# Patient Record
Sex: Male | Born: 1985 | Hispanic: Yes | Marital: Married | Smoking: Never smoker
Health system: Southern US, Community
[De-identification: ages and names within clinical notes are randomized; demographics above are authoritative.]

## PROBLEM LIST (undated history)

## (undated) DIAGNOSIS — Z789 Other specified health status: Secondary | ICD-10-CM

## (undated) HISTORY — PX: NO PAST SURGERIES: SHX2092

---

## 2020-12-12 ENCOUNTER — Emergency Department (HOSPITAL_COMMUNITY): Payer: Commercial Managed Care - PPO

## 2020-12-12 ENCOUNTER — Encounter (HOSPITAL_COMMUNITY): Payer: Self-pay

## 2020-12-12 ENCOUNTER — Inpatient Hospital Stay (HOSPITAL_COMMUNITY)
Admission: EM | Admit: 2020-12-12 | Discharge: 2020-12-15 | DRG: 493 | Disposition: A | Discharge status: Home / Self Care | Payer: Commercial Managed Care - PPO | Attending: General Surgery | Admitting: General Surgery

## 2020-12-12 ENCOUNTER — Inpatient Hospital Stay (HOSPITAL_COMMUNITY): Payer: Commercial Managed Care - PPO

## 2020-12-12 DIAGNOSIS — Z20822 Contact with and (suspected) exposure to covid-19: Secondary | ICD-10-CM | POA: Diagnosis present

## 2020-12-12 DIAGNOSIS — S82251A Displaced comminuted fracture of shaft of right tibia, initial encounter for closed fracture: Principal | ICD-10-CM | POA: Diagnosis present

## 2020-12-12 DIAGNOSIS — S32028A Other fracture of second lumbar vertebra, initial encounter for closed fracture: Secondary | ICD-10-CM | POA: Diagnosis present

## 2020-12-12 DIAGNOSIS — T148XXA Other injury of unspecified body region, initial encounter: Secondary | ICD-10-CM

## 2020-12-12 DIAGNOSIS — Y9289 Other specified places as the place of occurrence of the external cause: Secondary | ICD-10-CM

## 2020-12-12 DIAGNOSIS — W11XXXA Fall on and from ladder, initial encounter: Secondary | ICD-10-CM | POA: Diagnosis present

## 2020-12-12 DIAGNOSIS — R17 Unspecified jaundice: Secondary | ICD-10-CM | POA: Diagnosis present

## 2020-12-12 DIAGNOSIS — S32048A Other fracture of fourth lumbar vertebra, initial encounter for closed fracture: Secondary | ICD-10-CM | POA: Diagnosis present

## 2020-12-12 DIAGNOSIS — S82401A Unspecified fracture of shaft of right fibula, initial encounter for closed fracture: Secondary | ICD-10-CM

## 2020-12-12 DIAGNOSIS — S82451A Displaced comminuted fracture of shaft of right fibula, initial encounter for closed fracture: Secondary | ICD-10-CM | POA: Diagnosis present

## 2020-12-12 DIAGNOSIS — M79661 Pain in right lower leg: Secondary | ICD-10-CM | POA: Diagnosis present

## 2020-12-12 DIAGNOSIS — S32018A Other fracture of first lumbar vertebra, initial encounter for closed fracture: Secondary | ICD-10-CM | POA: Diagnosis present

## 2020-12-12 DIAGNOSIS — T1490XA Injury, unspecified, initial encounter: Secondary | ICD-10-CM

## 2020-12-12 DIAGNOSIS — G8911 Acute pain due to trauma: Secondary | ICD-10-CM | POA: Diagnosis present

## 2020-12-12 DIAGNOSIS — S32009A Unspecified fracture of unspecified lumbar vertebra, initial encounter for closed fracture: Secondary | ICD-10-CM | POA: Diagnosis present

## 2020-12-12 DIAGNOSIS — S32038A Other fracture of third lumbar vertebra, initial encounter for closed fracture: Secondary | ICD-10-CM | POA: Diagnosis present

## 2020-12-12 DIAGNOSIS — R58 Hemorrhage, not elsewhere classified: Secondary | ICD-10-CM

## 2020-12-12 DIAGNOSIS — R52 Pain, unspecified: Secondary | ICD-10-CM

## 2020-12-12 HISTORY — DX: Other specified health status: Z78.9

## 2020-12-12 LAB — CBC WITH DIFFERENTIAL/PLATELET
Abs Immature Granulocytes: 0.12 10*3/uL — ABNORMAL HIGH (ref 0.00–0.07)
Basophils Absolute: 0.1 10*3/uL (ref 0.0–0.1)
Basophils Relative: 0 %
Eosinophils Absolute: 0 10*3/uL (ref 0.0–0.5)
Eosinophils Relative: 0 %
HCT: 38.1 % — ABNORMAL LOW (ref 39.0–52.0)
Hemoglobin: 12.9 g/dL — ABNORMAL LOW (ref 13.0–17.0)
Immature Granulocytes: 1 %
Lymphocytes Relative: 5 %
Lymphs Abs: 0.9 10*3/uL (ref 0.7–4.0)
MCH: 30.7 pg (ref 26.0–34.0)
MCHC: 33.9 g/dL (ref 30.0–36.0)
MCV: 90.7 fL (ref 80.0–100.0)
Monocytes Absolute: 1.3 10*3/uL — ABNORMAL HIGH (ref 0.1–1.0)
Monocytes Relative: 7 %
Neutro Abs: 15.3 10*3/uL — ABNORMAL HIGH (ref 1.7–7.7)
Neutrophils Relative %: 87 %
Platelets: 216 10*3/uL (ref 150–400)
RBC: 4.2 MIL/uL — ABNORMAL LOW (ref 4.22–5.81)
RDW: 11.9 % (ref 11.5–15.5)
WBC: 17.6 10*3/uL — ABNORMAL HIGH (ref 4.0–10.5)
nRBC: 0 % (ref 0.0–0.2)

## 2020-12-12 LAB — COMPREHENSIVE METABOLIC PANEL
ALT: 26 U/L (ref 0–44)
AST: 43 U/L — ABNORMAL HIGH (ref 15–41)
Albumin: 4.1 g/dL (ref 3.5–5.0)
Alkaline Phosphatase: 74 U/L (ref 38–126)
Anion gap: 11 (ref 5–15)
BUN: 27 mg/dL — ABNORMAL HIGH (ref 6–20)
CO2: 22 mmol/L (ref 22–32)
Calcium: 9.1 mg/dL (ref 8.9–10.3)
Chloride: 107 mmol/L (ref 98–111)
Creatinine, Ser: 1.45 mg/dL — ABNORMAL HIGH (ref 0.61–1.24)
GFR, Estimated: >60 mL/min (ref >60)
Glucose, Bld: 107 mg/dL — ABNORMAL HIGH (ref 70–99)
Potassium: 3.9 mmol/L (ref 3.5–5.1)
Sodium: 140 mmol/L (ref 135–145)
Total Bilirubin: 2.1 mg/dL — ABNORMAL HIGH (ref 0.3–1.2)
Total Protein: 6.9 g/dL (ref 6.5–8.1)

## 2020-12-12 LAB — I-STAT CHEM 8, ED
BUN: 30 mg/dL — ABNORMAL HIGH (ref 6–20)
Calcium, Ion: 1.12 mmol/L — ABNORMAL LOW (ref 1.15–1.40)
Chloride: 107 mmol/L (ref 98–111)
Creatinine, Ser: 1.4 mg/dL — ABNORMAL HIGH (ref 0.61–1.24)
Glucose, Bld: 105 mg/dL — ABNORMAL HIGH (ref 70–99)
HCT: 38 % — ABNORMAL LOW (ref 39.0–52.0)
Hemoglobin: 12.9 g/dL — ABNORMAL LOW (ref 13.0–17.0)
Potassium: 4 mmol/L (ref 3.5–5.1)
Sodium: 141 mmol/L (ref 135–145)
TCO2: 23 mmol/L (ref 22–32)

## 2020-12-12 LAB — PROTIME-INR
INR: 1.1 (ref 0.8–1.2)
Prothrombin Time: 14.2 seconds (ref 11.4–15.2)

## 2020-12-12 LAB — CREATININE, SERUM
Creatinine, Ser: 1.22 mg/dL (ref 0.61–1.24)
GFR, Estimated: >60 mL/min (ref >60)

## 2020-12-12 LAB — RESP PANEL BY RT-PCR (FLU A&B, COVID) ARPGX2
Influenza A by PCR: NEGATIVE
Influenza B by PCR: NEGATIVE
SARS Coronavirus 2 by RT PCR: NEGATIVE

## 2020-12-12 MED ORDER — IOHEXOL 300 MG/ML  SOLN
100.0000 mL | Freq: Once | INTRAMUSCULAR | Status: AC | PRN
  Administered 2020-12-12: 100 mL via INTRAVENOUS

## 2020-12-12 MED ORDER — DOCUSATE SODIUM 100 MG PO CAPS
100.0000 mg | ORAL_CAPSULE | Freq: Two times a day (BID) | ORAL | Status: DC
  Administered 2020-12-12 – 2020-12-15 (×5): 100 mg via ORAL
  Filled 2020-12-12 (×5): qty 1

## 2020-12-12 MED ORDER — LACTATED RINGERS IV SOLN: INTRAVENOUS | Status: DC

## 2020-12-12 MED ORDER — FENTANYL CITRATE (PF) 100 MCG/2ML IJ SOLN
50.0000 ug | Freq: Once | INTRAMUSCULAR | Status: AC
  Administered 2020-12-12: 50 ug via INTRAVENOUS
  Filled 2020-12-12: qty 2

## 2020-12-12 MED ORDER — SODIUM CHLORIDE 0.9 % IV BOLUS
500.0000 mL | Freq: Once | INTRAVENOUS | Status: AC
  Administered 2020-12-12: 500 mL via INTRAVENOUS

## 2020-12-12 MED ORDER — HYDROMORPHONE HCL 1 MG/ML IJ SOLN
0.5000 mg | INTRAMUSCULAR | Status: DC | PRN
  Administered 2020-12-12 – 2020-12-13 (×2): 0.5 mg via INTRAVENOUS
  Filled 2020-12-12 (×2): qty 1

## 2020-12-12 MED ORDER — ENOXAPARIN SODIUM 30 MG/0.3ML IJ SOSY
30.0000 mg | PREFILLED_SYRINGE | Freq: Two times a day (BID) | INTRAMUSCULAR | Status: DC
  Administered 2020-12-13 – 2020-12-15 (×4): 30 mg via SUBCUTANEOUS
  Filled 2020-12-12 (×4): qty 0.3

## 2020-12-12 MED ORDER — ONDANSETRON 4 MG PO TBDP: 4.0000 mg | ORAL_TABLET | Freq: Four times a day (QID) | ORAL | Status: DC | PRN

## 2020-12-12 MED ORDER — ACETAMINOPHEN 325 MG PO TABS
650.0000 mg | ORAL_TABLET | Freq: Four times a day (QID) | ORAL | Status: DC
  Administered 2020-12-12 – 2020-12-15 (×11): 650 mg via ORAL
  Filled 2020-12-12 (×11): qty 2

## 2020-12-12 MED ORDER — ONDANSETRON HCL 4 MG/2ML IJ SOLN: 4.0000 mg | Freq: Four times a day (QID) | INTRAMUSCULAR | Status: DC | PRN

## 2020-12-12 MED ORDER — OXYCODONE HCL 5 MG PO TABS
5.0000 mg | ORAL_TABLET | ORAL | Status: DC | PRN
  Administered 2020-12-13: 5 mg via ORAL
  Administered 2020-12-13 – 2020-12-14 (×3): 10 mg via ORAL
  Administered 2020-12-14 (×2): 5 mg via ORAL
  Administered 2020-12-14 – 2020-12-15 (×2): 10 mg via ORAL
  Filled 2020-12-12: qty 1
  Filled 2020-12-12 (×2): qty 2
  Filled 2020-12-12: qty 1
  Filled 2020-12-12 (×5): qty 2

## 2020-12-12 MED ORDER — FENTANYL CITRATE (PF) 100 MCG/2ML IJ SOLN
75.0000 ug | Freq: Once | INTRAMUSCULAR | Status: AC
  Administered 2020-12-12: 75 ug via INTRAVENOUS
  Filled 2020-12-12: qty 2

## 2020-12-12 NOTE — ED Notes (Signed)
..  Trauma Response Nurse Note-  Reason for Call / Reason for Trauma activation:  Level 2 activation -- after arrival, - due to mechanism - fall from roof 15-20 foot fall from 2nd story roof-- was cutting trees, a branch hit right leg, causing him to fall off roof.   Initial Focused Assessment (If applicable, or please see trauma documentation):  Pt is awake, alert, denies any LOC- obviously deformed right lower leg, bruising lower back and left flank area.   Interventions: IV x 2 Labs CT Xray Meds  Plan of Care as of this note: Waiting results at this time  Event Summary:   -  The Following (if applicable):    -MD notified:     -Time of Page/Time of notification: 17:56    -TRN arrival Time: 18:00    -End time:

## 2020-12-12 NOTE — ED Provider Notes (Signed)
[facility] EMERGENCY DEPARTMENT Provider Note   CSN: [id number] Arrival date & time: 12/12/20  1744     History Chief Complaint  Patient presents with   Fall    Fall from ladder    Andrew Esparza is a 35 y.o. male.  HPI  35 year old male with no admitted past medical history who is Spanish-speaking presents the emergency department as a level 2 trauma.  Patient was reportedly at the top of the ladder and fell 15 feet onto the ground.  Obvious right lower extremity deformity and left flank pain.  Patient denies being on any blood thinning medication.  Interpreter used.  Vitals are stable on arrival, received ketamine prior to arrival for leg deformity and pain.  No past medical history on file.  There are no problems to display for this patient.   The histories are not reviewed yet. Please review them in the "History" navigator section and refresh this SmartLink.     No family history on file.     Home Medications Prior to Admission medications   Not on File    Allergies    Patient has no known allergies.  Review of Systems   Review of Systems  HENT:  Negative for trouble swallowing and voice change.   Eyes:  Negative for visual disturbance.  Respiratory:  Negative for shortness of breath.   Cardiovascular:  Negative for chest pain.  Gastrointestinal:  Negative for abdominal pain.  Genitourinary:  Positive for flank pain. Negative for difficulty urinating.  Musculoskeletal:  Positive for back pain. Negative for neck pain.       Right leg deformity and pain  Neurological:  Negative for headaches.  Psychiatric/Behavioral:  Negative for confusion.    Physical Exam Updated Vital Signs BP 120/78 (BP Location: Left Arm)   Pulse 96   Temp 98.4 F (36.9 C) (Oral)   Resp 11   Ht 5\' 4"  (1.626 m)   Wt 63.5 kg   SpO2 98%   BMI 24.03 kg/m   Physical Exam Vitals and nursing note reviewed.  Constitutional:      General: He is not in acute  distress. HENT:     Head: Normocephalic.     Comments: Midface is stable    Right Ear: External ear normal.     Left Ear: External ear normal.     Nose: Nose normal.     Comments: No septal hematoma Eyes:     Conjunctiva/sclera: Conjunctivae normal.     Pupils: Pupils are equal, round, and reactive to light.  Neck:     Comments: Cervical collar in place Cardiovascular:     Rate and Rhythm: Normal rate.  Pulmonary:     Effort: Pulmonary effort is normal. No respiratory distress.  Abdominal:     General: Abdomen is flat. There is no distension.     Palpations: Abdomen is soft.     Comments: Left flank ecchymosis  Musculoskeletal:        General: Swelling, deformity and signs of injury present.     Cervical back: No tenderness.     Comments: Pelvis is stable  Skin:    General: Skin is warm.  Neurological:     Mental Status: He is alert and oriented to person, place, and time.    ED Results / Procedures / Treatments   Labs (all labs ordered are listed, but only abnormal results are displayed) Labs Reviewed  CBC WITH DIFFERENTIAL/PLATELET - Abnormal; Notable for the following components:  Result Value   WBC 17.6 (*)    RBC 4.20 (*)    Hemoglobin 12.9 (*)    HCT 38.1 (*)    Neutro Abs 15.3 (*)    Monocytes Absolute 1.3 (*)    Abs Immature Granulocytes 0.12 (*)    All other components within normal limits  I-STAT CHEM 8, ED - Abnormal; Notable for the following components:   BUN 30 (*)    Creatinine, Ser 1.40 (*)    Glucose, Bld 105 (*)    Calcium, Ion 1.12 (*)    Hemoglobin 12.9 (*)    HCT 38.0 (*)    All other components within normal limits  RESP PANEL BY RT-PCR (FLU A&B, COVID) [id number]  PROTIME-INR  COMPREHENSIVE METABOLIC PANEL  I-STAT CHEM 8, ED  TYPE AND SCREEN    EKG None  Radiology DG Pelvis Portable  Result Date: 12/12/2020 CLINICAL DATA:  Fall from ladder, right leg deformity EXAM: PORTABLE PELVIS 1-2 VIEWS COMPARISON:  None. FINDINGS: Patient  is rotated. There is no evidence of pelvic fracture or diastasis. No pelvic bone lesions are seen. IMPRESSION: Negative. Electronically Signed   By: Duanne Guess D.O.   On: 12/12/2020 18:27   DG Chest Port 1 View  Result Date: 12/12/2020 CLINICAL DATA:  Fall from ladder EXAM: PORTABLE CHEST 1 VIEW COMPARISON:  None. FINDINGS: The heart size and mediastinal contours are within normal limits. Both lungs are clear. No pneumothorax. The visualized skeletal structures are unremarkable. IMPRESSION: No acute process in the chest. Electronically Signed   By: Duanne Guess D.O.   On: 12/12/2020 18:28    Procedures .Critical Care  Date/Time: 12/12/2020 7:52 PM Performed by: Rozelle Logan, DO Authorized by: Rozelle Logan, DO   Critical care provider statement:    Critical care time (minutes):  45   Critical care was necessary to treat or prevent imminent or life-threatening deterioration of the following conditions:  Trauma   Critical care was time spent personally by me on the following activities:  Discussions with consultants, evaluation of patient's response to treatment, examination of patient, ordering and performing treatments and interventions, ordering and review of laboratory studies, ordering and review of radiographic studies, pulse oximetry, re-evaluation of patient's condition, obtaining history from patient or surrogate and review of old charts   Medications Ordered in ED Medications  sodium chloride 0.9 % bolus 500 mL (500 mLs Intravenous New Bag/Given 12/12/20 1806)  fentaNYL (SUBLIMAZE) injection 50 mcg (50 mcg Intravenous Given 12/12/20 1807)    ED Course  I have reviewed the triage vital signs and the nursing notes.  Pertinent labs & imaging results that were available during my care of the patient were reviewed by me and considered in my medical decision making (see chart for details).    MDM Rules/Calculators/A&P                          35 year old male  presents emergency department as a level 2 trauma after a fall from approximately 15 to 20 feet.  Vitals are stable on arrival.  Patient is Spanish-speaking, interpreter used.  No admitted medical history or anticoagulation.  He has obvious left flank ecchymosis and a right leg deformity.  Imaging shows comminuted and displaced tibia and fibula fractures on the right, he remains neurovascularly intact, orthopedics Dr. Dion Saucier has been notified, ortho tech will come into splint.  CT of the chest abdomen pelvis shows L1-4 transverse process fractures with associated  intramuscular hematoma and left retroperitoneal hemorrhage.  Trauma surgery has been consulted and will admit the patient.  We are pending neurosurgical recommendations.  Vital signs remained stable.  Patients evaluation and results requires admission for further treatment and care. Patient agrees with admission plan, offers no new complaints and is stable/unchanged at time of admit.  Final Clinical Impression(s) / ED Diagnoses Final diagnoses:  Trauma    Rx / DC Orders ED Discharge Orders     None        Rozelle Logan, DO 12/12/20 1952

## 2020-12-12 NOTE — H&P (Signed)
Andrew Esparza [birth year]  [id number].    Requesting MD: Dr. Wilkie Aye Chief Complaint/Reason for Consult: fall from ladder, multiple injuries  HPI:  Andrew Esparza is a 35 yo male who presented to the ED as a level 2 trauma after a fall from a ladder. He was about 15-20 feet off the ground. He denies loss of consciousness. He complains primarily of right leg pain. Initial imaging workup showed lumbar transverse process fractures and a right tib-fib fracture. He also has a paraspinal muscle hematoma without active extravasation. He has been hemodynamically stable since arrival to the ED.  The history was obtained with the assistance of a Spanish interpreter via video call.  ROS: Review of Systems  Constitutional:  Negative for chills and fever.  Eyes:  Negative for blurred vision.  Respiratory:  Negative for shortness of breath.   Cardiovascular:  Negative for chest pain.  Gastrointestinal:  Negative for abdominal pain and nausea.  Musculoskeletal:  Positive for back pain.  Skin:  Negative for itching.  Neurological:  Negative for focal weakness and headaches.   No family history on file.  History reviewed. No pertinent past medical history.  History reviewed. No pertinent surgical history.  Social History:  reports previous alcohol use. No history on file for tobacco use and drug use.  Allergies: No Known Allergies  (Not in a hospital admission)    Physical Exam: Blood pressure 117/84, pulse 98, temperature 98.4 F (36.9 C), temperature source Oral, resp. rate 11, height  (1.626 m), weight 63.5 kg, SpO2 99 %. General: resting comfortably, appears stated age, no apparent distress Neurological: alert and oriented, no focal deficits, cranial nerves grossly in tact, GCS 15 HEENT: normocephalic, atraumatic, oropharynx clear, no scleral icterus, EOM in tact. Full neck ROM in tact. CV: regular rate and rhythm, extremities warm and well-perfused, palpable dorsalis pedis  pulses bilaterally Respiratory: normal work of breathing, symmetric chest wall expansion, superficial abrasions on the anterior chest wall but no chest wall deformities. Abdomen: soft, nondistended, nontender to deep palpation. No masses or organomegaly. Superficial abrasions on lower abdominal wall. Extremities: warm and well-perfused, deformity to right lower leg just distal to the knee with ecchymoses. Able to move toes on right foot. Palpable DP pulses bilaterally. Superficial abrasions on left anterior shin and right upper arm. No other extremity deformities. Psychiatric: normal mood and affect Skin: warm and dry, no jaundice   Results for orders placed or performed during the hospital encounter of 12/12/20 (from the past 48 hour(s))  CBC with Differential     Status: Abnormal   Collection Time: 12/12/20  5:55 PM  Result Value Ref Range   WBC 17.6 (H) 4.0 - 10.5 K/uL   RBC 4.20 (L) 4.22 - 5.81 MIL/uL   Hemoglobin 12.9 (L) 13.0 - 17.0 g/dL   HCT 40.9 (L) 81.1 - 91.4 %   MCV 90.7 80.0 - 100.0 fL   MCH 30.7 26.0 - 34.0 pg   MCHC 33.9 30.0 - 36.0 g/dL   RDW 78.2 95.6 - 21.3 %   Platelets 216 150 - 400 K/uL   nRBC 0.0 0.0 - 0.2 %   Neutrophils Relative % 87 %   Neutro Abs 15.3 (H) 1.7 - 7.7 K/uL   Lymphocytes Relative 5 %   Lymphs Abs 0.9 0.7 - 4.0 K/uL   Monocytes Relative 7 %   Monocytes Absolute 1.3 (H) 0.1 - 1.0 K/uL   Eosinophils Relative 0 %   Eosinophils Absolute 0.0 0.0 - 0.5  K/uL   Basophils Relative 0 %   Basophils Absolute 0.1 0.0 - 0.1 K/uL   Immature Granulocytes 1 %   Abs Immature Granulocytes 0.12 (H) 0.00 - 0.07 K/uL    Comment: Performed at [facility], [address], [place], [place] [address]  Comprehensive metabolic panel     Status: Abnormal   Collection Time: 12/12/20  5:55 PM  Result Value Ref Range   Sodium 140 135 - 145 mmol/L   Potassium 3.9 3.5 - 5.1 mmol/L   Chloride 107 98 - 111 mmol/L   CO2 22 22 - 32 mmol/L   Glucose, Bld 107 (H) 70 -  99 mg/dL    Comment: Glucose reference range applies only to samples taken after fasting for at least 8 hours.   BUN 27 (H) 6 - 20 mg/dL   Creatinine, Ser 4.09 (H) 0.61 - 1.24 mg/dL   Calcium 9.1 8.9 - 73.5 mg/dL   Total Protein 6.9 6.5 - 8.1 g/dL   Albumin 4.1 3.5 - 5.0 g/dL   AST 43 (H) 15 - 41 U/L   ALT 26 0 - 44 U/L   Alkaline Phosphatase 74 38 - 126 U/L   Total Bilirubin 2.1 (H) 0.3 - 1.2 mg/dL   GFR, Estimated >32 >99 mL/min    Comment: (NOTE) Calculated using the CKD-EPI Creatinine Equation (2021)    Anion gap 11 5 - 15    Comment: Performed at [facility], [address] [place], [place] [address]  Protime-INR     Status: None   Collection Time: 12/12/20  5:55 PM  Result Value Ref Range   Prothrombin Time 14.2 11.4 - 15.2 seconds   INR 1.1 0.8 - 1.2    Comment: (NOTE) INR goal varies based on device and disease states. Performed at [facility], [address], [place], [place] [address]   Resp Panel by RT-PCR (Flu A&B, Covid) Nasopharyngeal Swab     Status: None   Collection Time: 12/12/20  6:03 PM   Specimen: Nasopharyngeal Swab; Nasopharyngeal(NP) swabs in vial transport medium  Result Value Ref Range   SARS Coronavirus 2 by RT PCR NEGATIVE NEGATIVE    Comment: (NOTE) SARS-CoV-2 target nucleic acids are NOT DETECTED.  The SARS-CoV-2 RNA is generally detectable in upper respiratory specimens during the acute phase of infection. The lowest concentration of SARS-CoV-2 viral copies this assay can detect is 138 copies/mL. A negative result does not preclude SARS-Cov-2 infection and should not be used as the sole basis for treatment or other patient management decisions. A negative result may occur with  improper specimen collection/handling, submission of specimen other than nasopharyngeal swab, presence of viral mutation(s) within the areas targeted by this assay, and inadequate number of viral copies(<138 copies/mL). A negative result must be combined  with clinical observations, patient history, and epidemiological information. The expected result is Negative.  Fact Sheet for Patients:  BloggerCourse.com  Fact Sheet for Healthcare Providers:  SeriousBroker.it  This test is no t yet approved or cleared by the [place] FDA and  has been authorized for detection and/or diagnosis of SARS-CoV-2 by FDA under an Emergency Use Authorization (EUA). This EUA will remain  in effect (meaning this test can be used) for the duration of the COVID-19 declaration under Section 564(b)(1) of the Act, 21 U.S.C.section 360bbb-3(b)(1), unless the authorization is terminated  or revoked sooner.       Influenza A by PCR NEGATIVE NEGATIVE   Influenza B  by PCR NEGATIVE NEGATIVE    Comment: (NOTE) The Xpert Xpress SARS-CoV-2/FLU/RSV plus assay is intended as an aid in the diagnosis of influenza from Nasopharyngeal swab specimens and should not be used as a sole basis for treatment. Nasal washings and aspirates are unacceptable for Xpert Xpress SARS-CoV-2/FLU/RSV testing.  Fact Sheet for Patients: BloggerCourse.com  Fact Sheet for Healthcare Providers: SeriousBroker.it  This test is not yet approved or cleared by the [place] FDA and has been authorized for detection and/or diagnosis of SARS-CoV-2 by FDA under an Emergency Use Authorization (EUA). This EUA will remain in effect (meaning this test can be used) for the duration of the COVID-19 declaration under Section 564(b)(1) of the Act, 21 U.S.C. section 360bbb-3(b)(1), unless the authorization is terminated or revoked.  Performed at [facility], [address], [place], [place] [address]   I-stat chem 8, ed     Status: Abnormal   Collection Time: 12/12/20  6:07 PM  Result Value Ref Range   Sodium 141 135 - 145 mmol/L   Potassium 4.0 3.5 - 5.1 mmol/L   Chloride 107 98 - 111  mmol/L   BUN 30 (H) 6 - 20 mg/dL   Creatinine, Ser 4.58 (H) 0.61 - 1.24 mg/dL   Glucose, Bld 099 (H) 70 - 99 mg/dL    Comment: Glucose reference range applies only to samples taken after fasting for at least 8 hours.   Calcium, Ion 1.12 (L) 1.15 - 1.40 mmol/L   TCO2 23 22 - 32 mmol/L   Hemoglobin 12.9 (L) 13.0 - 17.0 g/dL   HCT 83.3 (L) 82.5 - 05.3 %   DG Knee 2 Views Right  Result Date: 12/12/2020 CLINICAL DATA:  Larey Seat off ladder EXAM: RIGHT KNEE - 1-2 VIEW COMPARISON:  None. FINDINGS: Highly comminuted fracture involving proximal metaphysis of the tibia with about 1/2 shaft diameter medial and posterior displacement of distal fracture fragment. Laterally and anteriorly displaced tibial tuberosity fracture fragment. No definite articular surface extension. Comminuted displaced and overriding proximal fibular shaft fracture IMPRESSION: 1. Comminuted and displaced proximal tibia fracture 2. Comminuted, displaced and overriding proximal fibular fracture Electronically Signed   By: Jasmine Pang M.D.   On: 12/12/2020 19:27   DG Tibia/Fibula Right  Result Date: 12/12/2020 CLINICAL DATA:  Larey Seat off ladder EXAM: RIGHT TIBIA AND FIBULA - 2 VIEW COMPARISON:  None. FINDINGS: Acute comminuted fracture involving the proximal shaft of the fibula with about 1 shaft diameter medial and posterior displacement of distal fracture fragment with about 14 mm of overriding. Comminuted proximal tibial fracture with about 1/2 shaft diameter medial displacement of distal fracture fragment. Lateral and anterior displacement of tibial tuberosity fracture fragment. No definite articular surface extension on these views. IMPRESSION: 1. Comminuted and displaced proximal tibia fracture 2. Comminuted, displaced and overriding proximal fibular fracture Electronically Signed   By: Jasmine Pang M.D.   On: 12/12/2020 19:26   CT Head Wo Contrast  Result Date: 12/12/2020 CLINICAL DATA:  Head trauma, mod-severe Fall from 15 foot  ladder EXAM: CT HEAD WITHOUT CONTRAST TECHNIQUE: Contiguous axial images were obtained from the base of the skull through the vertex without intravenous contrast. COMPARISON:  None. FINDINGS: Brain: No intracranial hemorrhage, mass effect, or midline shift. No hydrocephalus. The basilar cisterns are patent. No evidence of territorial infarct or acute ischemia. No extra-axial or intracranial fluid collection. Vascular: No hyperdense vessel or unexpected calcification. Skull: No fracture or focal lesion. Sinuses/Orbits: Mucosal thickening and scattered mucous retention cysts throughout the  paranasal sinuses. No evidence of fracture. The mastoid air cells are clear. Unremarkable orbits. Other: None. IMPRESSION: 1. No acute intracranial abnormality. No skull fracture. 2. Paranasal sinus disease. Electronically Signed   By: Narda Rutherford M.D.   On: 12/12/2020 19:18   CT Cervical Spine Wo Contrast  Result Date: 12/12/2020 CLINICAL DATA:  Head trauma, fall 15 feet from ladder. EXAM: CT CERVICAL SPINE WITHOUT CONTRAST TECHNIQUE: Multidetector CT imaging of the cervical spine was performed without intravenous contrast. Multiplanar CT image reconstructions were also generated. COMPARISON:  None. FINDINGS: Alignment: Normal. Skull base and vertebrae: No acute fracture. Vertebral body heights are maintained. The dens and skull base are intact. Non fusion posterior arch of C1, variant anatomy. There is also an accessory ossicle above the anterior arch of C1. Soft tissues and spinal canal: No prevertebral fluid or swelling. No visible canal hematoma. Disc levels:  Disc spaces are preserved. Upper chest: Assessed on concurrent chest CT, reported separately. Other: None. IMPRESSION: No fracture or subluxation of the cervical spine. Electronically Signed   By: Narda Rutherford M.D.   On: 12/12/2020 19:23   DG Pelvis Portable  Result Date: 12/12/2020 CLINICAL DATA:  Fall from ladder, right leg deformity EXAM: PORTABLE  PELVIS 1-2 VIEWS COMPARISON:  None. FINDINGS: Patient is rotated. There is no evidence of pelvic fracture or diastasis. No pelvic bone lesions are seen. IMPRESSION: Negative. Electronically Signed   By: Duanne Guess D.O.   On: 12/12/2020 18:27   CT CHEST ABDOMEN PELVIS W CONTRAST  Result Date: 12/12/2020 CLINICAL DATA:  Larey Seat from 15 foot ladder EXAM: CT CHEST, ABDOMEN, AND PELVIS WITH CONTRAST TECHNIQUE: Multidetector CT imaging of the chest, abdomen and pelvis was performed following the standard protocol during bolus administration of intravenous contrast. CONTRAST:  OMNIPAQUE IOHEXOL 300 MG/ML  SOLN COMPARISON:  None. FINDINGS: CT CHEST FINDINGS Cardiovascular: The heart and great vessels are unremarkable without pericardial effusion. No evidence of vascular injury. Mediastinum/Nodes: No enlarged mediastinal, hilar, or axillary lymph nodes. Thyroid gland, trachea, and esophagus demonstrate no significant findings. Lungs/Pleura: No acute airspace disease, effusion, or pneumothorax. The central airways are patent. Musculoskeletal: No acute or destructive bony lesions. Reconstructed images demonstrate no additional findings. CT ABDOMEN PELVIS FINDINGS Hepatobiliary: No hepatic injury or perihepatic hematoma. Gallbladder is unremarkable Pancreas: Unremarkable. No pancreatic ductal dilatation or surrounding inflammatory changes. Spleen: No splenic injury or perisplenic hematoma. Adrenals/Urinary Tract: No adrenal hemorrhage or renal injury identified. Bladder is unremarkable. Stomach/Bowel: No bowel obstruction or ileus. Normal appendix right lower quadrant. No bowel wall thickening or inflammatory change. Vascular/Lymphatic: No significant vascular findings. No evidence of vascular injury. No pathologic adenopathy. Reproductive: Prostate is unremarkable. Other: There is asymmetric enlargement of the left paraspinous musculature and left psoas muscle consistent with intramuscular hematoma. Left-sided  retroperitoneal fluid is identified consistent with hemorrhage after trauma. I do not see any active contrast extravasation. No free intraperitoneal fluid or free gas. Small fat containing left inguinal hernia. Musculoskeletal: There are displaced left transverse process fractures at L1, L2, L3, and L4. No other acute displaced fractures. Reconstructed images demonstrate no additional findings. IMPRESSION: 1. Transverse process fractures on the left at L1, L2, L3, and L4. 2. Small volume left-sided retroperitoneal hemorrhage, with intramuscular hematoma within the left paraspinous musculature and left psoas muscle. No evidence of contrast extravasation to suggest active hemorrhage. 3. No acute intrathoracic trauma. Critical Value/emergent results were called by telephone at the time of interpretation on 12/12/2020 at 7:28 pm to provider [place] Day , who  verbally acknowledged these results. Electronically Signed   By: Sharlet SalinaMichael  Brown M.D.   On: 12/12/2020 19:31   DG Chest Port 1 View  Result Date: 12/12/2020 CLINICAL DATA:  Fall from ladder EXAM: PORTABLE CHEST 1 VIEW COMPARISON:  None. FINDINGS: The heart size and mediastinal contours are within normal limits. Both lungs are clear. No pneumothorax. The visualized skeletal structures are unremarkable. IMPRESSION: No acute process in the chest. Electronically Signed   By: Duanne GuessNicholas  Plundo D.O.   On: 12/12/2020 18:28   DG Foot 2 Views Right  Result Date: 12/12/2020 CLINICAL DATA:  Larey SeatFell from ladder EXAM: RIGHT FOOT - 2 VIEW COMPARISON:  None. FINDINGS: Single lateral view of the foot shows no definitive fracture or malalignment. IMPRESSION: Negative. Electronically Signed   By: Jasmine PangKim  Fujinaga M.D.   On: 12/12/2020 19:27      Assessment/Plan 35 yo male s/p fall 15-feet from ladder. L1-L4 transverse process fractures Right tibia-fibula fracture L retroperitoneal and paraspinus muscle hematoma, small with no active extravasation  - Ortho consulted for  tib-fib fracture, tentatively planning for operative fixation tomorrow - C collar cleared at bedside - Regular diet, NPO midnight - Multimodal pain control - Recheck labs in am. Bilirubin mildly elevated to 2, recheck tomorrow. Trend hgb. - VTE: SCDs, begin lovenox tomorrow - Admit to trauma service, med-surg floor   Sophronia SimasShelby Holman Bonsignore, MD [facility], Hepatobiliary and Pancreatic Surgery 12/12/20 8:12 PM

## 2020-12-12 NOTE — Progress Notes (Signed)
CH responded to Level 2 trauma page; pt. being wheeled to CT when [facility] arrived; RN shared pt.'s wife is en route --> ED entrance security made aware.  CH returned when pt. was back from CT and had brief conversation w/Pt. via video interpreter; no immediate needs expressed aside from experiencing pain in leg.  CH remains available as needed.  Elpidio Anis, Chaplain Pager: [phone]

## 2020-12-12 NOTE — Progress Notes (Signed)
Orthopedic Tech Progress Note Patient Details:  Neftali Abair 10-12-1985 [id number]  Level 2 trauma  Patient ID: Alveta Heimlich, male   DOB: [birth year], 35 y.o.   MRN: [id number]  Docia Furl 12/12/2020, 8:00 PM

## 2020-12-12 NOTE — ED Triage Notes (Signed)
Pt arrived via [facility]. On EMS arrival to ED EMS advised pt is spanish speaking only EMS further reports pt fell from top of 15 ft ladder, and that pt denied LOC or hitting his head. EMS admin 12 mg ketamine en route with 500 mL NS, bilat 18g IV established. Pt caox4 on arrival to ED c/o pain in R leg and L flank pain.   EMS VS Initial BP 100/60 , 120/88 after 500 mL  HR 94 SpO2 98% RA RR 16

## 2020-12-12 NOTE — TOC CAGE-AID Note (Signed)
Transition of Care [facility]) - CAGE-AID Screening   Patient Details  Name: Andrew Esparza MRN: [id number] Date of Birth: [birth year]  Transition of [facility]) CM/SW Contact:    Katha Hamming, RN Phone Number: [phone] 12/12/2020, 8:43 PM   Clinical Narrative:  Pt denies alcohol and drug use.  CAGE-AID Screening:    Have You Ever Felt You Ought to Cut Down on Your Drinking or Drug Use?: No Have People Annoyed You By Critizing Your Drinking Or Drug Use?: No Have You Felt Bad Or Guilty About Your Drinking Or Drug Use?: No Have You Ever Had a Drink or Used Drugs First Thing In The Morning to Steady Your Nerves or to Get Rid of a Hangover?: No CAGE-AID Score: 0  Substance Abuse Education Offered: No (denies alcohol/drug use)

## 2020-12-12 NOTE — Consult Note (Signed)
   ORTHOPAEDIC CONSULTATION  REQUESTING PHYSICIAN: Md, Trauma, MD  Chief Complaint: right leg pan  HPI: Andrew Esparza is a 35 y.o. male who complains of right leg pain after a fall. He feel approximately 15 feet off the ground. Complains of severe right lower leg pain, just below his knee. Pain is worse with movement, better with rest and pain medication. Low back pain is moderate and and worse with movement. Denies loss of sensation. He is not on a blood thinner. No previous injury to his right leg. Denies pain elsewhere.  History obtained through spanish interpreter.    History reviewed. No pertinent past medical history. History reviewed. No pertinent surgical history. Social History   Socioeconomic History   Marital status: Not on file    Spouse name: Not on file   Number of children: Not on file   Years of education: Not on file   Highest education level: Not on file  Occupational History   Not on file  Tobacco Use   Smoking status: Never   Smokeless tobacco: Not on file  Substance and Sexual Activity   Alcohol use: Not Currently   Drug use: Not on file   Sexual activity: Not on file  Other Topics Concern   Not on file  Social History Narrative   Not on file   Social Determinants of Health   Financial Resource Strain: Not on file  Food Insecurity: Not on file  Transportation Needs: Not on file  Physical Activity: Not on file  Stress: Not on file  Social Connections: Not on file   No family history on file. No Known Allergies   Positive ROS: All other systems have been reviewed and were otherwise negative with the exception of those mentioned in the HPI and as above.  Physical Exam: General: Alert, no acute distress Cardiovascular: No pedal edema Respiratory: No cyanosis, no use of accessory musculature GI: No organomegaly, abdomen is soft and non-tender Skin: Abrasions over left tibia. Neurologic: Sensation intact distally Psychiatric: Patient is  competent for consent with normal mood and affect Lymphatic: No axillary or cervical lymphadenopathy  MUSCULOSKELETAL: RLE - No signs of open fracture. TTP to proximal tibia and fibula. Compartments firm but compressible. Dorsiflexion and plantarflexion intact at ankle.Able to flex and extend all toes. Endorses sensation to all toes and can pinpoint touch when eyes closed. 2+ DP pulse.    Assessment/Plan: Right proximal tibia and fibula fractures  - this is an acute severe injury which has the potential for complications including but not limited to compartment syndrome - long splint splint applied in ED - post-splint CT scan ordered - plan for ORIF with Dr. Dion Saucier tomorrow morning, NPO after midnight - q 2 hour neurovascular checks looking for pain with passive motion of toes     Armida Sans, PA-C    12/12/2020 9:06 PM

## 2020-12-13 ENCOUNTER — Inpatient Hospital Stay (HOSPITAL_COMMUNITY): Payer: Commercial Managed Care - PPO

## 2020-12-13 ENCOUNTER — Inpatient Hospital Stay (HOSPITAL_COMMUNITY): Payer: Commercial Managed Care - PPO | Admitting: Certified Registered Nurse Anesthetist

## 2020-12-13 ENCOUNTER — Encounter (HOSPITAL_COMMUNITY): Payer: Self-pay

## 2020-12-13 ENCOUNTER — Encounter (HOSPITAL_COMMUNITY): Admission: EM | Disposition: A | Discharge status: Home / Self Care | Payer: Self-pay | Source: Home / Self Care

## 2020-12-13 HISTORY — PX: ORIF TIBIA FRACTURE: SHX5416

## 2020-12-13 LAB — SURGICAL PCR SCREEN
MRSA, PCR: NEGATIVE
Staphylococcus aureus: NEGATIVE

## 2020-12-13 LAB — TYPE AND SCREEN
ABO/RH(D): O POS
Antibody Screen: NEGATIVE

## 2020-12-13 LAB — COMPREHENSIVE METABOLIC PANEL
ALT: 32 U/L (ref 0–44)
AST: 65 U/L — ABNORMAL HIGH (ref 15–41)
Albumin: 3.3 g/dL — ABNORMAL LOW (ref 3.5–5.0)
Alkaline Phosphatase: 55 U/L (ref 38–126)
Anion gap: 9 (ref 5–15)
BUN: 23 mg/dL — ABNORMAL HIGH (ref 6–20)
CO2: 24 mmol/L (ref 22–32)
Calcium: 8.5 mg/dL — ABNORMAL LOW (ref 8.9–10.3)
Chloride: 105 mmol/L (ref 98–111)
Creatinine, Ser: 1.14 mg/dL (ref 0.61–1.24)
GFR, Estimated: >60 mL/min (ref >60)
Glucose, Bld: 114 mg/dL — ABNORMAL HIGH (ref 70–99)
Potassium: 4.1 mmol/L (ref 3.5–5.1)
Sodium: 138 mmol/L (ref 135–145)
Total Bilirubin: 2.2 mg/dL — ABNORMAL HIGH (ref 0.3–1.2)
Total Protein: 5.7 g/dL — ABNORMAL LOW (ref 6.5–8.1)

## 2020-12-13 LAB — CBC
HCT: 30.9 % — ABNORMAL LOW (ref 39.0–52.0)
Hemoglobin: 10.5 g/dL — ABNORMAL LOW (ref 13.0–17.0)
MCH: 31.1 pg (ref 26.0–34.0)
MCHC: 34 g/dL (ref 30.0–36.0)
MCV: 91.4 fL (ref 80.0–100.0)
Platelets: 171 10*3/uL (ref 150–400)
RBC: 3.38 MIL/uL — ABNORMAL LOW (ref 4.22–5.81)
RDW: 12.4 % (ref 11.5–15.5)
WBC: 7.3 10*3/uL (ref 4.0–10.5)
nRBC: 0 % (ref 0.0–0.2)

## 2020-12-13 LAB — ABO/RH: ABO/RH(D): O POS

## 2020-12-13 LAB — HIV ANTIBODY (ROUTINE TESTING W REFLEX): HIV Screen 4th Generation wRfx: NONREACTIVE

## 2020-12-13 SURGERY — OPEN REDUCTION INTERNAL FIXATION (ORIF) TIBIA FRACTURE
Anesthesia: General | Laterality: Right

## 2020-12-13 MED ORDER — METHOCARBAMOL 500 MG PO TABS
500.0000 mg | ORAL_TABLET | Freq: Four times a day (QID) | ORAL | Status: DC | PRN
  Administered 2020-12-13 – 2020-12-14 (×5): 500 mg via ORAL
  Filled 2020-12-13 (×5): qty 1

## 2020-12-13 MED ORDER — BISACODYL 10 MG RE SUPP: 10.0000 mg | Freq: Every day | RECTAL | Status: DC | PRN

## 2020-12-13 MED ORDER — OXYCODONE HCL 5 MG/5ML PO SOLN: 5.0000 mg | Freq: Once | ORAL | Status: DC | PRN

## 2020-12-13 MED ORDER — LACTATED RINGERS IV SOLN: INTRAVENOUS | Status: DC | PRN

## 2020-12-13 MED ORDER — MIDAZOLAM HCL 2 MG/2ML IJ SOLN
INTRAMUSCULAR | Status: AC
  Filled 2020-12-13: qty 2

## 2020-12-13 MED ORDER — PROPOFOL 10 MG/ML IV BOLUS
INTRAVENOUS | Status: AC
  Filled 2020-12-13: qty 40

## 2020-12-13 MED ORDER — FENTANYL CITRATE (PF) 100 MCG/2ML IJ SOLN
25.0000 ug | INTRAMUSCULAR | Status: DC | PRN
  Administered 2020-12-13 (×2): 50 ug via INTRAVENOUS

## 2020-12-13 MED ORDER — CHLORHEXIDINE GLUCONATE 4 % EX LIQD
60.0000 mL | Freq: Once | CUTANEOUS | Status: AC
Start: 2020-12-13 — End: 2020-12-13
  Administered 2020-12-13: 4 via TOPICAL
  Filled 2020-12-13: qty 60

## 2020-12-13 MED ORDER — METHOCARBAMOL 1000 MG/10ML IJ SOLN
500.0000 mg | Freq: Four times a day (QID) | INTRAMUSCULAR | Status: DC | PRN
  Filled 2020-12-13: qty 5

## 2020-12-13 MED ORDER — LIDOCAINE HCL (CARDIAC) PF 100 MG/5ML IV SOSY
PREFILLED_SYRINGE | INTRAVENOUS | Status: DC | PRN
  Administered 2020-12-13: 60 mg via INTRATRACHEAL

## 2020-12-13 MED ORDER — METOCLOPRAMIDE HCL 5 MG PO TABS: 5.0000 mg | ORAL_TABLET | Freq: Three times a day (TID) | ORAL | Status: DC | PRN

## 2020-12-13 MED ORDER — FENTANYL CITRATE (PF) 250 MCG/5ML IJ SOLN
INTRAMUSCULAR | Status: AC
  Filled 2020-12-13: qty 5

## 2020-12-13 MED ORDER — SENNA 8.6 MG PO TABS
1.0000 | ORAL_TABLET | Freq: Two times a day (BID) | ORAL | Status: DC
  Administered 2020-12-13 – 2020-12-15 (×4): 8.6 mg via ORAL
  Filled 2020-12-13 (×4): qty 1

## 2020-12-13 MED ORDER — ONDANSETRON HCL 4 MG/2ML IJ SOLN
INTRAMUSCULAR | Status: DC | PRN
  Administered 2020-12-13: 4 mg via INTRAVENOUS

## 2020-12-13 MED ORDER — OXYCODONE HCL 5 MG PO TABS: 5.0000 mg | ORAL_TABLET | Freq: Once | ORAL | Status: DC | PRN

## 2020-12-13 MED ORDER — CEFAZOLIN SODIUM-DEXTROSE 2-4 GM/100ML-% IV SOLN
INTRAVENOUS | Status: AC
  Filled 2020-12-13: qty 100

## 2020-12-13 MED ORDER — ONDANSETRON HCL 4 MG/2ML IJ SOLN: 4.0000 mg | Freq: Once | INTRAMUSCULAR | Status: DC | PRN

## 2020-12-13 MED ORDER — LACTATED RINGERS IV SOLN: INTRAVENOUS | Status: DC

## 2020-12-13 MED ORDER — FENTANYL CITRATE (PF) 100 MCG/2ML IJ SOLN
INTRAMUSCULAR | Status: AC
  Filled 2020-12-13: qty 2

## 2020-12-13 MED ORDER — BUPIVACAINE HCL (PF) 0.25 % IJ SOLN
INTRAMUSCULAR | Status: DC | PRN
  Administered 2020-12-13: 30 mL

## 2020-12-13 MED ORDER — MIDAZOLAM HCL 2 MG/2ML IJ SOLN
INTRAMUSCULAR | Status: DC | PRN
  Administered 2020-12-13: 2 mg via INTRAVENOUS

## 2020-12-13 MED ORDER — DIPHENHYDRAMINE HCL 12.5 MG/5ML PO ELIX: 12.5000 mg | ORAL_SOLUTION | ORAL | Status: DC | PRN

## 2020-12-13 MED ORDER — MAGNESIUM CITRATE PO SOLN: 1.0000 | Freq: Once | ORAL | Status: DC | PRN

## 2020-12-13 MED ORDER — CEFAZOLIN SODIUM-DEXTROSE 2-4 GM/100ML-% IV SOLN: 2.0000 g | INTRAVENOUS | Status: DC

## 2020-12-13 MED ORDER — PROPOFOL 10 MG/ML IV BOLUS
INTRAVENOUS | Status: DC | PRN
  Administered 2020-12-13: 180 mg via INTRAVENOUS

## 2020-12-13 MED ORDER — FENTANYL CITRATE (PF) 250 MCG/5ML IJ SOLN
INTRAMUSCULAR | Status: DC | PRN
  Administered 2020-12-13 (×2): 100 ug via INTRAVENOUS
  Administered 2020-12-13: 50 ug via INTRAVENOUS

## 2020-12-13 MED ORDER — ROCURONIUM BROMIDE 10 MG/ML (PF) SYRINGE
PREFILLED_SYRINGE | INTRAVENOUS | Status: DC | PRN
  Administered 2020-12-13: 50 mg via INTRAVENOUS

## 2020-12-13 MED ORDER — POLYETHYLENE GLYCOL 3350 17 G PO PACK: 17.0000 g | PACK | Freq: Every day | ORAL | Status: DC | PRN

## 2020-12-13 MED ORDER — CEFAZOLIN SODIUM-DEXTROSE 2-3 GM-%(50ML) IV SOLR
INTRAVENOUS | Status: DC | PRN
  Administered 2020-12-13: 2 g via INTRAVENOUS

## 2020-12-13 MED ORDER — CEFAZOLIN SODIUM-DEXTROSE 2-4 GM/100ML-% IV SOLN
2.0000 g | Freq: Four times a day (QID) | INTRAVENOUS | Status: AC
  Administered 2020-12-13 – 2020-12-14 (×3): 2 g via INTRAVENOUS
  Filled 2020-12-13 (×3): qty 100

## 2020-12-13 MED ORDER — CHLORHEXIDINE GLUCONATE 0.12 % MT SOLN
15.0000 mL | Freq: Once | OROMUCOSAL | Status: AC
  Administered 2020-12-13: 15 mL via OROMUCOSAL

## 2020-12-13 MED ORDER — CHLORHEXIDINE GLUCONATE 0.12 % MT SOLN
OROMUCOSAL | Status: AC
  Filled 2020-12-13: qty 15

## 2020-12-13 MED ORDER — ENSURE PRE-SURGERY PO LIQD
296.0000 mL | Freq: Once | ORAL | Status: AC
  Administered 2020-12-13: 296 mL via ORAL
  Filled 2020-12-13: qty 296

## 2020-12-13 MED ORDER — METOCLOPRAMIDE HCL 5 MG/ML IJ SOLN
5.0000 mg | Freq: Three times a day (TID) | INTRAMUSCULAR | Status: DC | PRN
Start: 2020-12-13 — End: 2020-12-16

## 2020-12-13 MED ORDER — SUGAMMADEX SODIUM 200 MG/2ML IV SOLN
INTRAVENOUS | Status: DC | PRN
  Administered 2020-12-13: 200 mg via INTRAVENOUS

## 2020-12-13 MED ORDER — BUPIVACAINE HCL (PF) 0.25 % IJ SOLN
INTRAMUSCULAR | Status: AC
  Filled 2020-12-13: qty 30

## 2020-12-13 SURGICAL SUPPLY — 73 items
BANDAGE ESMARK 6X9 LF (GAUZE/BANDAGES/DRESSINGS) IMPLANT
BIT DRILL 100X2.5XANTM LCK (BIT) ×1 IMPLANT
BIT DRILL 2.5X2.75 QC CALB (BIT) ×2 IMPLANT
BIT DRILL CAL (BIT) ×1 IMPLANT
BIT DRL 100X2.5XANTM LCK (BIT) ×1
BNDG ELASTIC 4X5.8 VLCR STR LF (GAUZE/BANDAGES/DRESSINGS) ×2 IMPLANT
BNDG ELASTIC 6X10 VLCR STRL LF (GAUZE/BANDAGES/DRESSINGS) ×2 IMPLANT
BNDG ELASTIC 6X5.8 VLCR STR LF (GAUZE/BANDAGES/DRESSINGS) ×4 IMPLANT
BNDG ESMARK 6X9 LF (GAUZE/BANDAGES/DRESSINGS)
BNDG GAUZE ELAST 4 BULKY (GAUZE/BANDAGES/DRESSINGS) ×2 IMPLANT
BOOTCOVER CLEANROOM LRG (PROTECTIVE WEAR) ×4 IMPLANT
COVER SURGICAL LIGHT HANDLE (MISCELLANEOUS) ×2 IMPLANT
COVER WAND RF STERILE (DRAPES) ×2 IMPLANT
CUFF TOURN SGL QUICK 34 (TOURNIQUET CUFF)
CUFF TOURN SGL QUICK 42 (TOURNIQUET CUFF) IMPLANT
CUFF TRNQT CYL 34X4.125X (TOURNIQUET CUFF) IMPLANT
DECANTER SPIKE VIAL GLASS SM (MISCELLANEOUS) IMPLANT
DRAPE C-ARM 42X72 X-RAY (DRAPES) IMPLANT
DRAPE OEC MINIVIEW 54X84 (DRAPES) IMPLANT
DRAPE U-SHAPE 47X51 STRL (DRAPES) IMPLANT
DRILL BIT 2.5MM (BIT) ×1
DRILL BIT CAL (BIT) ×2
DRSG PAD ABDOMINAL 8X10 ST (GAUZE/BANDAGES/DRESSINGS) ×2 IMPLANT
DRSG XEROFORM 1X8 (GAUZE/BANDAGES/DRESSINGS) ×2 IMPLANT
DURAPREP 26ML APPLICATOR (WOUND CARE) ×2 IMPLANT
ELECT REM PT RETURN 9FT ADLT (ELECTROSURGICAL) ×2
ELECTRODE REM PT RTRN 9FT ADLT (ELECTROSURGICAL) ×1 IMPLANT
GAUZE SPONGE 4X4 12PLY STRL (GAUZE/BANDAGES/DRESSINGS) ×2 IMPLANT
GAUZE XEROFORM 1X8 LF (GAUZE/BANDAGES/DRESSINGS) ×4 IMPLANT
GLOVE BIO SURGEON STRL SZ7 (GLOVE) ×2 IMPLANT
GLOVE ORTHO TXT STRL SZ7.5 (GLOVE) ×2 IMPLANT
GLOVE SURG UNDER POLY LF SZ7 (GLOVE) ×2 IMPLANT
GOWN STRL REUS W/ TWL LRG LVL3 (GOWN DISPOSABLE) IMPLANT
GOWN STRL REUS W/TWL 2XL LVL3 (GOWN DISPOSABLE) ×2 IMPLANT
GOWN STRL REUS W/TWL LRG LVL3 (GOWN DISPOSABLE)
IMMOBILIZER KNEE 20 (SOFTGOODS) ×2 IMPLANT
IMMOBILIZER KNEE 20 THIGH 36 (SOFTGOODS) ×1 IMPLANT
K-WIRE ACE 1.6X6 (WIRE) ×2
KIT BASIN OR (CUSTOM PROCEDURE TRAY) ×2 IMPLANT
KIT TURNOVER KIT B (KITS) ×2 IMPLANT
KWIRE ACE 1.6X6 (WIRE) ×1 IMPLANT
MANIFOLD NEPTUNE II (INSTRUMENTS) ×2 IMPLANT
NS IRRIG 1000ML POUR BTL (IV SOLUTION) ×2 IMPLANT
PACK ORTHO EXTREMITY (CUSTOM PROCEDURE TRAY) ×2 IMPLANT
PAD ABD 8X10 STRL (GAUZE/BANDAGES/DRESSINGS) ×2 IMPLANT
PAD ARMBOARD 7.5X6 YLW CONV (MISCELLANEOUS) ×4 IMPLANT
PAD CAST 4YDX4 CTTN HI CHSV (CAST SUPPLIES) ×2 IMPLANT
PADDING CAST COTTON 4X4 STRL (CAST SUPPLIES) ×2
PLATE LOCK 9H STD RT PROX TIB (Plate) ×2 IMPLANT
SCREW CORT FT 32X3.5XNONLOCK (Screw) ×2 IMPLANT
SCREW CORTICAL 3.5MM  30MM (Screw) ×1 IMPLANT
SCREW CORTICAL 3.5MM  32MM (Screw) ×2 IMPLANT
SCREW CORTICAL 3.5MM 30MM (Screw) ×1 IMPLANT
SCREW CORTICAL 3.5MM 36MM (Screw) ×2 IMPLANT
SCREW LOCK CORT STAR 3.5X65 (Screw) ×6 IMPLANT
SCREW LOCK CORT STAR 3.5X70 (Screw) ×2 IMPLANT
SCREW LOW PROF CORTICAL 3.5X80 (Screw) ×2 IMPLANT
SPONGE LAP 4X18 RFD (DISPOSABLE) ×4 IMPLANT
STAPLER VISISTAT 35W (STAPLE) ×2 IMPLANT
SUCTION FRAZIER HANDLE 10FR (MISCELLANEOUS) ×1
SUCTION TUBE FRAZIER 10FR DISP (MISCELLANEOUS) ×1 IMPLANT
SUT ETHILON 2 0 FS 18 (SUTURE) ×10 IMPLANT
SUT VIC AB 0 CT1 27 (SUTURE) ×2
SUT VIC AB 0 CT1 27XBRD ANBCTR (SUTURE) ×2 IMPLANT
SUT VIC AB 3-0 CT1 27 (SUTURE) ×2
SUT VIC AB 3-0 CT1 TAPERPNT 27 (SUTURE) ×2 IMPLANT
SYR CONTROL 10ML LL (SYRINGE) IMPLANT
TOWEL GREEN STERILE (TOWEL DISPOSABLE) ×2 IMPLANT
TOWEL GREEN STERILE FF (TOWEL DISPOSABLE) ×2 IMPLANT
TUBE CONNECTING 12X1/4 (SUCTIONS) ×2 IMPLANT
UNDERPAD 30X36 HEAVY ABSORB (UNDERPADS AND DIAPERS) ×2 IMPLANT
WATER STERILE IRR 1000ML POUR (IV SOLUTION) ×2 IMPLANT
YANKAUER SUCT BULB TIP NO VENT (SUCTIONS) IMPLANT

## 2020-12-13 NOTE — Progress Notes (Signed)
[facility] Progress Note:   Day of Surgery  Subjective: Mental status is clear but [place] speaking poor.  Complaints pain-. Objective: Vital signs in last 24 hours: Temp:  [98 F (36.7 C)-98.8 F (37.1 C)] 98.2 F (36.8 C) (06/18 0845) Pulse Rate:  [68-98] 70 (06/18 0845) Resp:  [10-25] 16 (06/18 0845) BP: (90-120)/(58-88) 95/67 (06/18 0845) SpO2:  [97 %-100 %] 100 % (06/18 0845) Weight:  [63.5 kg-66.9 kg] 66.9 kg (06/18 0137)  Intake/Output from previous day: 06/17 0701 - 06/18 0700 In: 1500 [I.V.:500; IV Piggyback:1000] Out: 0  Intake/Output this shift: Total I/O In: -  Out: 300 [Urine:300]  Physical Exam: Work of breathing is not labored.    Lab Results:  Results for orders placed or performed during the hospital encounter of 12/12/20 (from the past 48 hour(s))  CBC with Differential     Status: Abnormal   Collection Time: 12/12/20  5:55 PM  Result Value Ref Range   WBC 17.6 (H) 4.0 - 10.5 K/uL   RBC 4.20 (L) 4.22 - 5.81 MIL/uL   Hemoglobin 12.9 (L) 13.0 - 17.0 g/dL   HCT 09.838.1 (L) 11.939.0 - 14.752.0 %   MCV 90.7 80.0 - 100.0 fL   MCH 30.7 26.0 - 34.0 pg   MCHC 33.9 30.0 - 36.0 g/dL   RDW 82.911.9 56.211.5 - 13.015.5 %   Platelets 216 150 - 400 K/uL   nRBC 0.0 0.0 - 0.2 %   Neutrophils Relative % 87 %   Neutro Abs 15.3 (H) 1.7 - 7.7 K/uL   Lymphocytes Relative 5 %   Lymphs Abs 0.9 0.7 - 4.0 K/uL   Monocytes Relative 7 %   Monocytes Absolute 1.3 (H) 0.1 - 1.0 K/uL   Eosinophils Relative 0 %   Eosinophils Absolute 0.0 0.0 - 0.5 K/uL   Basophils Relative 0 %   Basophils Absolute 0.1 0.0 - 0.1 K/uL   Immature Granulocytes 1 %   Abs Immature Granulocytes 0.12 (H) 0.00 - 0.07 K/uL    Comment: Performed at [facility], [address] [place], [place] [address]  Comprehensive metabolic panel     Status: Abnormal   Collection Time: 12/12/20  5:55 PM  Result Value Ref Range   Sodium 140 135 - 145 mmol/L   Potassium 3.9 3.5 - 5.1 mmol/L   Chloride 107 98 - 111  mmol/L   CO2 22 22 - 32 mmol/L   Glucose, Bld 107 (H) 70 - 99 mg/dL    Comment: Glucose reference range applies only to samples taken after fasting for at least 8 hours.   BUN 27 (H) 6 - 20 mg/dL   Creatinine, Ser 4.691.45 (H) 0.61 - 1.24 mg/dL   Calcium 9.1 8.9 - 62.910.3 mg/dL   Total Protein 6.9 6.5 - 8.1 g/dL   Albumin 4.1 3.5 - 5.0 g/dL   AST 43 (H) 15 - 41 U/L   ALT 26 0 - 44 U/L   Alkaline Phosphatase 74 38 - 126 U/L   Total Bilirubin 2.1 (H) 0.3 - 1.2 mg/dL   GFR, Estimated >52>60 >84>60 mL/min    Comment: (NOTE) Calculated using the CKD-EPI Creatinine Equation (2021)    Anion gap 11 5 - 15    Comment: Performed at [facility], [address] [place], [place] [address]  Protime-INR     Status: None   Collection Time: 12/12/20  5:55 PM  Result Value Ref Range   Prothrombin Time 14.2 11.4 - 15.2  seconds   INR 1.1 0.8 - 1.2    Comment: (NOTE) INR goal varies based on device and disease states. Performed at [facility], [address], [place], [place] [address]   Type and screen [facility]     Status: None   Collection Time: 12/12/20  6:01 PM  Result Value Ref Range   ABO/RH(D) O POS    Antibody Screen NEG    Sample Expiration      12/15/2020,2359 Performed at [facility], [address], [place], [place] [address]   Resp Panel by RT-PCR (Flu A&B, Covid) Nasopharyngeal Swab     Status: None   Collection Time: 12/12/20  6:03 PM   Specimen: Nasopharyngeal Swab; Nasopharyngeal(NP) swabs in vial transport medium  Result Value Ref Range   SARS Coronavirus 2 by RT PCR NEGATIVE NEGATIVE    Comment: (NOTE) SARS-CoV-2 target nucleic acids are NOT DETECTED.  The SARS-CoV-2 RNA is generally detectable in upper respiratory specimens during the acute phase of infection. The lowest concentration of SARS-CoV-2 viral copies this assay can detect is 138 copies/mL. A negative result does not preclude SARS-Cov-2 infection and should not be used as the  sole basis for treatment or other patient management decisions. A negative result may occur with  improper specimen collection/handling, submission of specimen other than nasopharyngeal swab, presence of viral mutation(s) within the areas targeted by this assay, and inadequate number of viral copies(<138 copies/mL). A negative result must be combined with clinical observations, patient history, and epidemiological information. The expected result is Negative.  Fact Sheet for Patients:  BloggerCourse.com  Fact Sheet for Healthcare Providers:  SeriousBroker.it  This test is no t yet approved or cleared by the [place] FDA and  has been authorized for detection and/or diagnosis of SARS-CoV-2 by FDA under an Emergency Use Authorization (EUA). This EUA will remain  in effect (meaning this test can be used) for the duration of the COVID-19 declaration under Section 564(b)(1) of the Act, 21 U.S.C.section 360bbb-3(b)(1), unless the authorization is terminated  or revoked sooner.       Influenza A by PCR NEGATIVE NEGATIVE   Influenza B by PCR NEGATIVE NEGATIVE    Comment: (NOTE) The Xpert Xpress SARS-CoV-2/FLU/RSV plus assay is intended as an aid in the diagnosis of influenza from Nasopharyngeal swab specimens and should not be used as a sole basis for treatment. Nasal washings and aspirates are unacceptable for Xpert Xpress SARS-CoV-2/FLU/RSV testing.  Fact Sheet for Patients: BloggerCourse.com  Fact Sheet for Healthcare Providers: SeriousBroker.it  This test is not yet approved or cleared by the [place] FDA and has been authorized for detection and/or diagnosis of SARS-CoV-2 by FDA under an Emergency Use Authorization (EUA). This EUA will remain in effect (meaning this test can be used) for the duration of the COVID-19 declaration under Section 564(b)(1) of the Act, 21  U.S.C. section 360bbb-3(b)(1), unless the authorization is terminated or revoked.  Performed at [facility], [address] [place], [place] [address]   I-stat chem 8, ed     Status: Abnormal   Collection Time: 12/12/20  6:07 PM  Result Value Ref Range   Sodium 141 135 - 145 mmol/L   Potassium 4.0 3.5 - 5.1 mmol/L   Chloride 107 98 - 111 mmol/L   BUN 30 (H) 6 - 20 mg/dL   Creatinine, Ser 9.48 (H) 0.61 - 1.24 mg/dL   Glucose, Bld 546 (H) 70 - 99 mg/dL  Comment: Glucose reference range applies only to samples taken after fasting for at least 8 hours.   Calcium, Ion 1.12 (L) 1.15 - 1.40 mmol/L   TCO2 23 22 - 32 mmol/L   Hemoglobin 12.9 (L) 13.0 - 17.0 g/dL   HCT 65.7 (L) 84.6 - 96.2 %  HIV Antibody (routine testing w rflx)     Status: None   Collection Time: 12/12/20 10:07 PM  Result Value Ref Range   HIV Screen 4th Generation wRfx Non Reactive Non Reactive    Comment: Performed at [facility], [address] [place], [place] [address]  Creatinine, serum     Status: None   Collection Time: 12/12/20 10:07 PM  Result Value Ref Range   Creatinine, Ser 1.22 0.61 - 1.24 mg/dL   GFR, Estimated >13 >24 mL/min    Comment: (NOTE) Calculated using the CKD-EPI Creatinine Equation (2021) Performed at [facility], [address], [place], [place] [address]   CBC     Status: Abnormal   Collection Time: 12/13/20  1:50 AM  Result Value Ref Range   WBC 7.3 4.0 - 10.5 K/uL   RBC 3.38 (L) 4.22 - 5.81 MIL/uL   Hemoglobin 10.5 (L) 13.0 - 17.0 g/dL   HCT 72.5 (L) 36.6 - 44.0 %   MCV 91.4 80.0 - 100.0 fL   MCH 31.1 26.0 - 34.0 pg   MCHC 34.0 30.0 - 36.0 g/dL   RDW 34.7 42.5 - 95.6 %   Platelets 171 150 - 400 K/uL   nRBC 0.0 0.0 - 0.2 %    Comment: Performed at [facility], [address] [place], [place] [address]  Comprehensive metabolic panel     Status: Abnormal   Collection Time: 12/13/20  1:50 AM  Result Value Ref Range   Sodium 138 135 - 145 mmol/L    Potassium 4.1 3.5 - 5.1 mmol/L   Chloride 105 98 - 111 mmol/L   CO2 24 22 - 32 mmol/L   Glucose, Bld 114 (H) 70 - 99 mg/dL    Comment: Glucose reference range applies only to samples taken after fasting for at least 8 hours.   BUN 23 (H) 6 - 20 mg/dL   Creatinine, Ser 4.33 0.61 - 1.24 mg/dL   Calcium 8.5 (L) 8.9 - 10.3 mg/dL   Total Protein 5.7 (L) 6.5 - 8.1 g/dL   Albumin 3.3 (L) 3.5 - 5.0 g/dL   AST 65 (H) 15 - 41 U/L   ALT 32 0 - 44 U/L   Alkaline Phosphatase 55 38 - 126 U/L   Total Bilirubin 2.2 (H) 0.3 - 1.2 mg/dL   GFR, Estimated >29 >51 mL/min    Comment: (NOTE) Calculated using the CKD-EPI Creatinine Equation (2021)    Anion gap 9 5 - 15    Comment: Performed at [facility], [address], [place], [place] [address]  ABO/Rh     Status: None   Collection Time: 12/13/20  1:50 AM  Result Value Ref Range   ABO/RH(D)      O POS Performed at [facility], [address] [place], [place] [address]   Surgical pcr screen     Status: None   Collection Time: 12/13/20  2:28 AM   Specimen: Nasal Mucosa; Nasal Swab  Result Value Ref Range   MRSA, PCR NEGATIVE NEGATIVE   Staphylococcus aureus NEGATIVE NEGATIVE    Comment: (NOTE) The Xpert SA Assay (FDA approved for  NASAL specimens in patients 66 years of age and older), is one component of a comprehensive surveillance program. It is not intended to diagnose infection nor to guide or monitor treatment. Performed at [facility], [address], [place], [place] [address]     Radiology/Results: DG Knee 2 Views Right  Result Date: 12/12/2020 CLINICAL DATA:  Larey Seat off ladder EXAM: RIGHT KNEE - 1-2 VIEW COMPARISON:  None. FINDINGS: Highly comminuted fracture involving proximal metaphysis of the tibia with about 1/2 shaft diameter medial and posterior displacement of distal fracture fragment. Laterally and anteriorly displaced tibial tuberosity fracture fragment. No definite articular surface extension. Comminuted  displaced and overriding proximal fibular shaft fracture IMPRESSION: 1. Comminuted and displaced proximal tibia fracture 2. Comminuted, displaced and overriding proximal fibular fracture Electronically Signed   By: Jasmine Pang M.D.   On: 12/12/2020 19:27   DG Tibia/Fibula Right  Result Date: 12/12/2020 CLINICAL DATA:  Larey Seat off ladder EXAM: RIGHT TIBIA AND FIBULA - 2 VIEW COMPARISON:  None. FINDINGS: Acute comminuted fracture involving the proximal shaft of the fibula with about 1 shaft diameter medial and posterior displacement of distal fracture fragment with about 14 mm of overriding. Comminuted proximal tibial fracture with about 1/2 shaft diameter medial displacement of distal fracture fragment. Lateral and anterior displacement of tibial tuberosity fracture fragment. No definite articular surface extension on these views. IMPRESSION: 1. Comminuted and displaced proximal tibia fracture 2. Comminuted, displaced and overriding proximal fibular fracture Electronically Signed   By: Jasmine Pang M.D.   On: 12/12/2020 19:26   CT Head Wo Contrast  Result Date: 12/12/2020 CLINICAL DATA:  Head trauma, mod-severe Fall from 15 foot ladder EXAM: CT HEAD WITHOUT CONTRAST TECHNIQUE: Contiguous axial images were obtained from the base of the skull through the vertex without intravenous contrast. COMPARISON:  None. FINDINGS: Brain: No intracranial hemorrhage, mass effect, or midline shift. No hydrocephalus. The basilar cisterns are patent. No evidence of territorial infarct or acute ischemia. No extra-axial or intracranial fluid collection. Vascular: No hyperdense vessel or unexpected calcification. Skull: No fracture or focal lesion. Sinuses/Orbits: Mucosal thickening and scattered mucous retention cysts throughout the paranasal sinuses. No evidence of fracture. The mastoid air cells are clear. Unremarkable orbits. Other: None. IMPRESSION: 1. No acute intracranial abnormality. No skull fracture. 2. Paranasal sinus  disease. Electronically Signed   By: Narda Rutherford M.D.   On: 12/12/2020 19:18   CT Cervical Spine Wo Contrast  Result Date: 12/12/2020 CLINICAL DATA:  Head trauma, fall 15 feet from ladder. EXAM: CT CERVICAL SPINE WITHOUT CONTRAST TECHNIQUE: Multidetector CT imaging of the cervical spine was performed without intravenous contrast. Multiplanar CT image reconstructions were also generated. COMPARISON:  None. FINDINGS: Alignment: Normal. Skull base and vertebrae: No acute fracture. Vertebral body heights are maintained. The dens and skull base are intact. Non fusion posterior arch of C1, variant anatomy. There is also an accessory ossicle above the anterior arch of C1. Soft tissues and spinal canal: No prevertebral fluid or swelling. No visible canal hematoma. Disc levels:  Disc spaces are preserved. Upper chest: Assessed on concurrent chest CT, reported separately. Other: None. IMPRESSION: No fracture or subluxation of the cervical spine. Electronically Signed   By: Narda Rutherford M.D.   On: 12/12/2020 19:23   DG Pelvis Portable  Result Date: 12/12/2020 CLINICAL DATA:  Fall from ladder, right leg deformity EXAM: PORTABLE PELVIS 1-2 VIEWS COMPARISON:  None. FINDINGS: Patient is rotated. There is no evidence of pelvic fracture or diastasis. No pelvic bone  lesions are seen. IMPRESSION: Negative. Electronically Signed   By: Duanne Guess D.O.   On: 12/12/2020 18:27   CT TIBIA FIBULA RIGHT WO CONTRAST  Result Date: 12/12/2020 CLINICAL DATA:  Right proximal tibial and fibular fracture. EXAM: CT OF THE LOWER RIGHT EXTREMITY WITHOUT CONTRAST TECHNIQUE: Multidetector CT imaging of the right lower extremity was performed according to the standard protocol. COMPARISON:  Radiographs earlier today FINDINGS: Bones/Joint/Cartilage Comminuted and displaced proximal tibial metaphyseal fracture. Dominant fracture plane is transversely oriented. Greatest osseous distraction of 15-18 mm. There is no extension to the  articular surface or knee joint. Transverse proximal fibular shaft fracture has osseous overriding of 4-5 mm. Nondisplaced extension involves the anterior fibular cortex and courses cranially, fracture terminating just before the proximal tibia/fibular articulation. No fracture of the distal tibia or fibula. Ankle alignment is maintained. Ankle mortise is preserved. There is no knee or ankle joint effusion. No fracture of the included distal femur or patella. Ligaments Suboptimally assessed by CT. Muscles and Tendons Heterogeneous enlargement of the posterior cuff muscle compartment likely due to intramuscular hematoma. Intact Achilles tendon. Soft tissues Subcutaneous soft tissue edema and hematoma at the fracture site. There is also hematoma about the medial aspect of the knee and distal thigh. IMPRESSION: 1. Comminuted and displaced proximal tibial metaphyseal fracture. Dominant fracture plane is transversely oriented. No extension to the articular surface or knee joint. 2. Transverse proximal fibular shaft fracture has osseous overriding of 4-5 mm. Nondisplaced extension involves the anterior fibular cortex and courses cranially, fracture terminating just before the proximal tibia/fibular articulation. 3. Heterogeneous enlargement of the posterior cuff muscle compartment likely due to intramuscular hematoma. Soft tissue hematoma about the medial aspect of the lower leg and knee. Electronically Signed   By: Narda Rutherford M.D.   On: 12/12/2020 22:13   CT CHEST ABDOMEN PELVIS W CONTRAST  Result Date: 12/12/2020 CLINICAL DATA:  Larey Seat from 15 foot ladder EXAM: CT CHEST, ABDOMEN, AND PELVIS WITH CONTRAST TECHNIQUE: Multidetector CT imaging of the chest, abdomen and pelvis was performed following the standard protocol during bolus administration of intravenous contrast. CONTRAST:  OMNIPAQUE IOHEXOL 300 MG/ML  SOLN COMPARISON:  None. FINDINGS: CT CHEST FINDINGS Cardiovascular: The heart and great vessels are  unremarkable without pericardial effusion. No evidence of vascular injury. Mediastinum/Nodes: No enlarged mediastinal, hilar, or axillary lymph nodes. Thyroid gland, trachea, and esophagus demonstrate no significant findings. Lungs/Pleura: No acute airspace disease, effusion, or pneumothorax. The central airways are patent. Musculoskeletal: No acute or destructive bony lesions. Reconstructed images demonstrate no additional findings. CT ABDOMEN PELVIS FINDINGS Hepatobiliary: No hepatic injury or perihepatic hematoma. Gallbladder is unremarkable Pancreas: Unremarkable. No pancreatic ductal dilatation or surrounding inflammatory changes. Spleen: No splenic injury or perisplenic hematoma. Adrenals/Urinary Tract: No adrenal hemorrhage or renal injury identified. Bladder is unremarkable. Stomach/Bowel: No bowel obstruction or ileus. Normal appendix right lower quadrant. No bowel wall thickening or inflammatory change. Vascular/Lymphatic: No significant vascular findings. No evidence of vascular injury. No pathologic adenopathy. Reproductive: Prostate is unremarkable. Other: There is asymmetric enlargement of the left paraspinous musculature and left psoas muscle consistent with intramuscular hematoma. Left-sided retroperitoneal fluid is identified consistent with hemorrhage after trauma. I do not see any active contrast extravasation. No free intraperitoneal fluid or free gas. Small fat containing left inguinal hernia. Musculoskeletal: There are displaced left transverse process fractures at L1, L2, L3, and L4. No other acute displaced fractures. Reconstructed images demonstrate no additional findings. IMPRESSION: 1. Transverse process fractures on the left at L1, L2,  L3, and L4. 2. Small volume left-sided retroperitoneal hemorrhage, with intramuscular hematoma within the left paraspinous musculature and left psoas muscle. No evidence of contrast extravasation to suggest active hemorrhage. 3. No acute intrathoracic  trauma. Critical Value/emergent results were called by telephone at the time of interpretation on 12/12/2020 at 7:28 pm to provider [facility] , who verbally acknowledged these results. Electronically Signed   By: Sharlet Salina M.D.   On: 12/12/2020 19:31   DG Chest Port 1 View  Result Date: 12/12/2020 CLINICAL DATA:  Fall from ladder EXAM: PORTABLE CHEST 1 VIEW COMPARISON:  None. FINDINGS: The heart size and mediastinal contours are within normal limits. Both lungs are clear. No pneumothorax. The visualized skeletal structures are unremarkable. IMPRESSION: No acute process in the chest. Electronically Signed   By: Duanne Guess D.O.   On: 12/12/2020 18:28   DG Foot 2 Views Right  Result Date: 12/12/2020 CLINICAL DATA:  Larey Seat from ladder EXAM: RIGHT FOOT - 2 VIEW COMPARISON:  None. FINDINGS: Single lateral view of the foot shows no definitive fracture or malalignment. IMPRESSION: Negative. Electronically Signed   By: Jasmine Pang M.D.   On: 12/12/2020 19:27    Anti-infectives: Anti-infectives (From admission, onward)    Start     Dose/Rate Route Frequency Ordered Stop   12/13/20 0800  ceFAZolin (ANCEF) IVPB 2g/100 mL premix        2 g 200 mL/hr over 30 Minutes Intravenous On call to O.R. 12/13/20 0138 12/14/20 0559       Assessment/Plan: Problem List: Patient Active Problem List   Diagnosis Date Noted   Fall from ladder 12/12/2020   Tibia/fibula fracture, right, closed, initial encounter 12/12/2020   Lumbar transverse process fracture (HCC) 12/12/2020   Acute pain due to trauma 12/12/2020   Hyperbilirubinemia 12/12/2020    To OR today with orthopedics Day of Surgery    LOS: 1 day   Matt B. Daphine Deutscher, MD, [facility] [phone] to reach the surgeon on call.    12/13/2020 9:46 AM

## 2020-12-13 NOTE — Progress Notes (Signed)
Patient seen and examined by Dr. Dion Saucier and myself. Able to flex and extend all toes, minimal pain with active or passive motion. Distal sensation intact. Foot warm and well perfused.  continue q 2 hours neurovascular checks - checking for pain with passive motion of the toes.    Janine Ores, PA-C

## 2020-12-13 NOTE — Progress Notes (Signed)
Pt returned to room 6N12 via bed after surgery. Received report from Vernona Rieger, RN in PACU. See reassessment. Will continue to monitor.

## 2020-12-13 NOTE — Plan of Care (Signed)

## 2020-12-13 NOTE — Progress Notes (Signed)
Koleen Nimrod, RN in Short Stay unable to take pre-op report at this time. Pre-procedure checklist complete. Koleen Nimrod, RN aware pt awaiting transport. OR personnel transported pt via bed.

## 2020-12-13 NOTE — Transfer of Care (Signed)
Immediate Anesthesia Transfer of Care Note  Patient: Andrew Esparza  Procedure(s) Performed: OPEN REDUCTION INTERNAL FIXATION (ORIF) TIBIA FRACTURE (Right)  Patient Location: PACU  Anesthesia Type:General  Level of Consciousness: drowsy and patient cooperative  Airway & Oxygen Therapy: Patient Spontanous Breathing and Patient connected to face mask oxygen  Post-op Assessment: Report given to RN and Post -op Vital signs reviewed and stable  Post vital signs: Reviewed and stable  Last Vitals:  Vitals Value Taken Time  BP 121/93 12/13/20 1423  Temp 36.3 C 12/13/20 1423  Pulse 93 12/13/20 1425  Resp 24 12/13/20 1425  SpO2 100 % 12/13/20 1425  Vitals shown include unvalidated device data.  Last Pain:  Vitals:   12/13/20 1423  TempSrc:   PainSc: 0-No pain      Patients Stated Pain Goal: 0 (12/13/20 0135)  Complications: No notable events documented.

## 2020-12-13 NOTE — Progress Notes (Signed)
[employer] blood bank bracelet has different last name listed. Pt stated it was done in error yesterday. Pt able to confirm name and birthday.

## 2020-12-13 NOTE — Progress Notes (Signed)
Patient seen and examined.  Right proximal tibia fracture.  Neurologically he is intact, and has active and passive motion of the toes without significant pain.  He is currently splinted.  The plan is for right proximal tibia open reduction internal fixation versus external fixation depending on soft tissue swelling.  The risks benefits and alternatives were discussed with the patient including but not limited to the risks of nonoperative treatment, versus surgical intervention including infection, bleeding, nerve injury, malunion, nonunion, the need for revision surgery, hardware prominence, hardware failure, the need for hardware removal, blood clots, cardiopulmonary complications, morbidity, mortality, among others, and they were willing to proceed.    Eulas Post, MD

## 2020-12-13 NOTE — Anesthesia Postprocedure Evaluation (Signed)
Anesthesia Post Note  Patient: Andrew Esparza  Procedure(s) Performed: OPEN REDUCTION INTERNAL FIXATION (ORIF) TIBIA FRACTURE (Right)     Patient location during evaluation: PACU Anesthesia Type: General Level of consciousness: awake and alert Pain management: pain level controlled Vital Signs Assessment: post-procedure vital signs reviewed and stable Respiratory status: spontaneous breathing, nonlabored ventilation, respiratory function stable and patient connected to nasal cannula oxygen Cardiovascular status: blood pressure returned to baseline and stable Postop Assessment: no apparent nausea or vomiting Anesthetic complications: no   No notable events documented.  Last Vitals:  Vitals:   12/13/20 1527 12/13/20 1827  BP:  122/77  Pulse:  76  Resp:  16  Temp:  36.8 C  SpO2: 100% 100%    Last Pain:  Vitals:   12/13/20 1827  TempSrc: Oral  PainSc:                  Lidiya Reise COKER

## 2020-12-13 NOTE — Anesthesia Procedure Notes (Signed)
Procedure Name: Intubation Date/Time: 12/13/2020 11:59 AM Performed by: Claudina Lick, CRNA Pre-anesthesia Checklist: Patient identified, Emergency Drugs available, Suction available and Patient being monitored Patient Re-evaluated:Patient Re-evaluated prior to induction Oxygen Delivery Method: Circle system utilized Preoxygenation: Pre-oxygenation with 100% oxygen Induction Type: IV induction Ventilation: Mask ventilation without difficulty Laryngoscope Size: Miller and 2 Grade View: Grade I Tube type: Oral Tube size: 7.5 mm Number of attempts: 1 Airway Equipment and Method: Stylet and Oral airway Placement Confirmation: ETT inserted through vocal cords under direct vision, positive ETCO2 and breath sounds checked- equal and bilateral Secured at: 22 cm Tube secured with: Tape Dental Injury: Teeth and Oropharynx as per pre-operative assessment

## 2020-12-13 NOTE — Progress Notes (Signed)
Patient arrived to [phone]. Speaks Spanish-used Teacher, early years/pre. AX0X4, not in acute distress. No DOB/SOB noted, 99%room air. Patient is able to move toes on right foot. warm&dry, palpable pedal pulse(+2) denies pain with passive motion of toes. VS T98.8 BP 111/75 RR18 PR76. Call bell wihtin reach and will continue to monitor.

## 2020-12-13 NOTE — Op Note (Signed)
12/13/2020  2:38 PM  PATIENT:  Andrew Esparza    PRE-OPERATIVE DIAGNOSIS: Right proximal third tibia and fibula fracture  POST-OPERATIVE DIAGNOSIS:  Same  PROCEDURE: Open reduction internal fixation right proximal third tibia fracture with anterior compartment fasciotomy  SURGEON:  Eulas Post, MD  PHYSICIAN ASSISTANT: Janine Ores, PA-C, present and scrubbed throughout the case, critical for completion in a timely fashion, and for retraction, instrumentation, and closure.  ANESTHESIA:   General  PREOPERATIVE INDICATIONS:  Andrew Esparza is a  35 y.o. male who fell from a ladder yesterday and had a right tibia fracture.  He was monitored overnight and his compartments were soft and soft tissue amenable for internal fixation.  He elected for surgical management.  The risks benefits and alternatives were discussed with the patient preoperatively including but not limited to the risks of infection, bleeding, nerve injury, cardiopulmonary complications, the need for revision surgery, among others, and the patient was willing to proceed.  We also discussed the potential risks for compartment syndrome, as well as the need for revision surgery, malunion, nonunion, among others.  ESTIMATED BLOOD LOSS: 150 mL  OPERATIVE IMPLANTS: Biomet Alps proximal tibial plate with 3 proximal raft locking screws, 1 proximal nonlocking screw, and 1 proximal locking kickstand screw, with 4 distal nonlocking screws  OPERATIVE FINDINGS: Comminuted proximal tibial shaft fracture, unstable, all of the musculature appeared viable  OPERATIVE PROCEDURE: The patient was brought to the operating room and placed in supine position.  General anesthesia was administered.  The right lower extremity was prepped and draped in usual sterile fashion.  Timeout was performed.  Curvilinear incision was made through the skin, and the fracture site exposed.  The periosteum was elevated off of the tibia.  I  reduced the fracture as closed anatomically as possible, applied a plate, secured the plate proximally and distally with a K wire, confirmed the position on AP and lateral views.  I then secured the plate proximally to the bone using a nonlocking screw.  I had excellent contour of the plate to the bone, and I adjusted the fracture site to get this is anatomic as possible on multiple planes.  I secured the plate distally with a nonlocking screw and then progressively secured the fixation both proximally and distally.  Excellent fixation was achieved and near anatomic alignment on AP and lateral views as well as internal and external rotation views.  I then extended the fasciotomy subperiosteally along the crest of the tibia in order to minimize the risk for postoperative compartment syndrome.  I did repair the fascia over the plate as much as possible, the plate had proximally was superficial to the fascia, but then went deep as it turned the corner.  I irrigated the wounds copiously, injected local anesthetic, and then repaired the skin with nylon suture.  The compartments felt compressible, although somewhat swollen, but appeared satisfactory.  We will plan to monitor closely over the next 12 to 24 hours to ensure the maintenance of neurologic function.  Sterile gauze was applied followed by a knee immobilizer.  He was awakened and returned to the PACU in stable and satisfactory condition.  There were no complications and he tolerated the procedure well.

## 2020-12-13 NOTE — Anesthesia Preprocedure Evaluation (Signed)
Anesthesia Evaluation  Patient identified by MRN, date of birth, ID band Patient awake    Reviewed: Allergy & Precautions, NPO status , Patient's Chart, lab work & pertinent test results  Airway Mallampati: II  TM Distance: >3 FB Neck ROM: Full    Dental  (+) Teeth Intact, Dental Advisory Given   Pulmonary    breath sounds clear to auscultation       Cardiovascular  Rhythm:Regular Rate:Normal     Neuro/Psych    GI/Hepatic   Endo/Other    Renal/GU      Musculoskeletal   Abdominal   Peds  Hematology   Anesthesia Other Findings   Reproductive/Obstetrics                             Anesthesia Physical Anesthesia Plan  ASA: 2  Anesthesia Plan: General   Post-op Pain Management:    Induction: Intravenous  PONV Risk Score and Plan: Ondansetron and Dexamethasone  Airway Management Planned: Oral ETT  Additional Equipment:   Intra-op Plan:   Post-operative Plan: Extubation in OR  Informed Consent: I have reviewed the patients History and Physical, chart, labs and discussed the procedure including the risks, benefits and alternatives for the proposed anesthesia with the patient or authorized representative who has indicated his/her understanding and acceptance.     Dental advisory given  Plan Discussed with: CRNA and Anesthesiologist  Anesthesia Plan Comments:         Anesthesia Quick Evaluation  

## 2020-12-14 NOTE — Progress Notes (Signed)
Patient called/asked for pain medication. When assessed, he stated he have 8/10 knee pain. Patient grimaces and states the pain is 5/10 with passive motion of toes. Pedal pulses(+2), warm, able to wiggle toes- used interpreter for the conversation. PRN pain med given. On call Howell Rucks MD made aware about patient's increase pain with passive motion. MD adviced to elevated extremity with 2 pillows.   Call bell within reach and will continue to monitor.

## 2020-12-14 NOTE — Plan of Care (Signed)
  Problem: Education: Goal: Knowledge of General Education information will improve Description: Including pain rating scale, medication(s)/side effects and non-pharmacologic comfort measures Outcome: Progressing   Problem: Health Behavior/Discharge Planning: Goal: Ability to manage health-related needs will improve Outcome: Progressing   Problem: Clinical Measurements: Goal: Ability to maintain clinical measurements within normal limits will improve Outcome: Progressing Goal: Respiratory complications will improve Outcome: Progressing   Problem: Activity: Goal: Risk for activity intolerance will decrease Outcome: Progressing   Problem: Nutrition: Goal: Adequate nutrition will be maintained Outcome: Progressing   Problem: Coping: Goal: Level of anxiety will decrease Outcome: Progressing   Problem: Elimination: Goal: Will not experience complications related to urinary retention Outcome: Progressing   Problem: Pain Managment: Goal: General experience of comfort will improve Outcome: Progressing   Problem: Safety: Goal: Ability to remain free from injury will improve Outcome: Progressing   Problem: Skin Integrity: Goal: Risk for impaired skin integrity will decrease Outcome: Progressing   

## 2020-12-14 NOTE — Progress Notes (Signed)
Spoke with nursing staff at around 2a.  I have called to check back with the patient, and spoke with him myself.  He reports the pain as improving, and says he can feel his toes and move them actively without significant pain.  This does not seem to be an evolving compartment syndrome.  Continue elevation, and likely wrap the foot into the ace wrap later this morning to help control the foot swelling.   Eulas Post, MD 4:42 AM

## 2020-12-14 NOTE — Progress Notes (Signed)
[facility]  Georgena Spurling, MD    Laurier Nancy, PA-C [address] [place], [place], [place]  [address]                             [phone]   PROGRESS NOTE  Subjective:  negative for Chest Pain  negative for Shortness of Breath  negative for Nausea/Vomiting   negative for Calf Pain  negative for Bowel Movement   Tolerating Diet: yes         Patient reports pain as 6 on 0-10 scale.    Objective: Vital signs in last 24 hours:   Patient Vitals for the past 24 hrs:  BP Temp Temp src Pulse Resp SpO2  12/14/20 0356 103/62 98.3 F (36.8 C) Oral 83 18 96 %  12/13/20 2301 113/70 99.6 F (37.6 C) Oral 87 18 100 %  12/13/20 1827 122/77 98.2 F (36.8 C) Oral 76 16 100 %  12/13/20 1527 -- -- -- -- -- 100 %  12/13/20 1526 115/77 98.3 F (36.8 C) Oral 74 14 100 %  12/13/20 1453 125/74 97.8 F (36.6 C) -- 77 17 100 %  12/13/20 1438 121/70 -- -- 83 13 93 %  12/13/20 1423 (!) 121/93 (!) 97.3 F (36.3 C) -- 92 20 100 %  12/13/20 1011 (!) 104/51 97.8 F (36.6 C) Oral 69 18 100 %  12/13/20 0845 95/67 98.2 F (36.8 C) Oral 70 16 100 %    @flow {1959:LAST@   Intake/Output from previous day:   06/18 0701 - 06/19 0700 In: 1150 [I.V.:1100] Out: 400 [Urine:300]   Intake/Output this shift:   No intake/output data recorded.   Intake/Output      06/18 0701 06/19 0700   I.V. (mL/kg) 1100 (16.4)   IV Piggyback 50   Total Intake(mL/kg) 1150 (17.2)   Urine (mL/kg/hr) 300 (0.2)   Blood 100   Total Output 400   Net +750          LABORATORY DATA: Recent Labs    12/12/20 1755 12/12/20 1807 12/13/20 0150  WBC 17.6*  --  7.3  HGB 12.9* 12.9* 10.5*  HCT 38.1* 38.0* 30.9*  PLT 216  --  171   Recent Labs    12/12/20 1755 12/12/20 1807 12/12/20 2207 12/13/20 0150  NA 140 141  --  138  K 3.9 4.0  --  4.1  CL 107 107  --  105  CO2 22  --   --  24  BUN 27* 30*  --  23*  CREATININE 1.45* 1.40* 1.22 1.14  GLUCOSE 107* 105*  --  114*  CALCIUM 9.1   --   --  8.5*   Lab Results  Component Value Date   INR 1.1 12/12/2020    Examination:  General appearance: alert, cooperative, and no distress Extremities: extremities normal, atraumatic, no cyanosis or edema  Wound Exam: clean, dry, intact   Drainage:  None: wound tissue dry  Motor Exam: Quadriceps and Hamstrings Intact  Sensory Exam: Superficial Peroneal and Deep Peroneal normal   Assessment:    1 Day Post-Op  Procedure(s) (LRB): OPEN REDUCTION INTERNAL FIXATION (ORIF) TIBIA FRACTURE (Right)  ADDITIONAL DIAGNOSIS:  Principal Problem:   Tibia/fibula fracture, right, closed, initial encounter Active Problems:   Fall from ladder   Lumbar transverse process fracture (HCC)   Acute pain due to trauma   Hyperbilirubinemia     Plan: Physical Therapy as ordered  Non Weight Bearing (NWB)  DVT Prophylaxis:  Lovenox  DISCHARGE PLAN: Home  Patient doing well, pain score at 6. Will monitor PT/OT recs with planned D/C tomorrow.    Guy Sandifer 12/14/2020, 6:56 AM

## 2020-12-14 NOTE — Progress Notes (Signed)
[facility] Progress Note:   1 Day Post-Op  Subjective: Mental status is alert.  Complaints mainly right leg and not that sore. Objective: Vital signs in last 24 hours: Temp:  [97.3 F (36.3 C)-99.6 F (37.6 C)] 98.3 F (36.8 C) (06/19 0356) Pulse Rate:  [69-92] 83 (06/19 0356) Resp:  [13-20] 18 (06/19 0356) BP: (95-125)/(51-93) 103/62 (06/19 0356) SpO2:  [93 %-100 %] 96 % (06/19 0356) FiO2 (%):  [21 %] 21 % (06/18 1527)  Intake/Output from previous day: 06/18 0701 - 06/19 0700 In: 1710 [P.O.:360; I.V.:1100; IV Piggyback:250] Out: 400 [Urine:300; Blood:100] Intake/Output this shift: No intake/output data recorded.  Physical Exam: Work of breathing is not labored.  Chest is nontender and respirations are normal.  Abdomen is nontender  Lab Results:  Results for orders placed or performed during the hospital encounter of 12/12/20 (from the past 48 hour(s))  CBC with Differential     Status: Abnormal   Collection Time: 12/12/20  5:55 PM  Result Value Ref Range   WBC 17.6 (H) 4.0 - 10.5 K/uL   RBC 4.20 (L) 4.22 - 5.81 MIL/uL   Hemoglobin 12.9 (L) 13.0 - 17.0 g/dL   HCT 16.1 (L) 09.6 - 04.5 %   MCV 90.7 80.0 - 100.0 fL   MCH 30.7 26.0 - 34.0 pg   MCHC 33.9 30.0 - 36.0 g/dL   RDW 40.9 81.1 - 91.4 %   Platelets 216 150 - 400 K/uL   nRBC 0.0 0.0 - 0.2 %   Neutrophils Relative % 87 %   Neutro Abs 15.3 (H) 1.7 - 7.7 K/uL   Lymphocytes Relative 5 %   Lymphs Abs 0.9 0.7 - 4.0 K/uL   Monocytes Relative 7 %   Monocytes Absolute 1.3 (H) 0.1 - 1.0 K/uL   Eosinophils Relative 0 %   Eosinophils Absolute 0.0 0.0 - 0.5 K/uL   Basophils Relative 0 %   Basophils Absolute 0.1 0.0 - 0.1 K/uL   Immature Granulocytes 1 %   Abs Immature Granulocytes 0.12 (H) 0.00 - 0.07 K/uL    Comment: Performed at [facility], [address] [place], [place] [address]  Comprehensive metabolic panel     Status: Abnormal   Collection Time: 12/12/20  5:55 PM  Result Value Ref Range    Sodium 140 135 - 145 mmol/L   Potassium 3.9 3.5 - 5.1 mmol/L   Chloride 107 98 - 111 mmol/L   CO2 22 22 - 32 mmol/L   Glucose, Bld 107 (H) 70 - 99 mg/dL    Comment: Glucose reference range applies only to samples taken after fasting for at least 8 hours.   BUN 27 (H) 6 - 20 mg/dL   Creatinine, Ser 6.21 (H) 0.61 - 1.24 mg/dL   Calcium 9.1 8.9 - 30.8 mg/dL   Total Protein 6.9 6.5 - 8.1 g/dL   Albumin 4.1 3.5 - 5.0 g/dL   AST 43 (H) 15 - 41 U/L   ALT 26 0 - 44 U/L   Alkaline Phosphatase 74 38 - 126 U/L   Total Bilirubin 2.1 (H) 0.3 - 1.2 mg/dL   GFR, Estimated >65 >78 mL/min    Comment: (NOTE) Calculated using the CKD-EPI Creatinine Equation (2021)    Anion gap 11 5 - 15    Comment: Performed at [facility], [address] [place], [place] [address]  Protime-INR     Status: None   Collection Time: 12/12/20  5:55 PM  Result Value  Ref Range   Prothrombin Time 14.2 11.4 - 15.2 seconds   INR 1.1 0.8 - 1.2    Comment: (NOTE) INR goal varies based on device and disease states. Performed at [facility], [address], [place], [place] [address]   Type and screen [facility]     Status: None   Collection Time: 12/12/20  6:01 PM  Result Value Ref Range   ABO/RH(D) O POS    Antibody Screen NEG    Sample Expiration      12/15/2020,2359 Performed at [facility], [address] [place], [place] [address]   Resp Panel by RT-PCR (Flu A&B, Covid) Nasopharyngeal Swab     Status: None   Collection Time: 12/12/20  6:03 PM   Specimen: Nasopharyngeal Swab; Nasopharyngeal(NP) swabs in vial transport medium  Result Value Ref Range   SARS Coronavirus 2 by RT PCR NEGATIVE NEGATIVE    Comment: (NOTE) SARS-CoV-2 target nucleic acids are NOT DETECTED.  The SARS-CoV-2 RNA is generally detectable in upper respiratory specimens during the acute phase of infection. The lowest concentration of SARS-CoV-2 viral copies this assay can detect is 138 copies/mL. A  negative result does not preclude SARS-Cov-2 infection and should not be used as the sole basis for treatment or other patient management decisions. A negative result may occur with  improper specimen collection/handling, submission of specimen other than nasopharyngeal swab, presence of viral mutation(s) within the areas targeted by this assay, and inadequate number of viral copies(<138 copies/mL). A negative result must be combined with clinical observations, patient history, and epidemiological information. The expected result is Negative.  Fact Sheet for Patients:  BloggerCourse.com  Fact Sheet for Healthcare Providers:  SeriousBroker.it  This test is no t yet approved or cleared by the [place] FDA and  has been authorized for detection and/or diagnosis of SARS-CoV-2 by FDA under an Emergency Use Authorization (EUA). This EUA will remain  in effect (meaning this test can be used) for the duration of the COVID-19 declaration under Section 564(b)(1) of the Act, 21 U.S.C.section 360bbb-3(b)(1), unless the authorization is terminated  or revoked sooner.       Influenza A by PCR NEGATIVE NEGATIVE   Influenza B by PCR NEGATIVE NEGATIVE    Comment: (NOTE) The Xpert Xpress SARS-CoV-2/FLU/RSV plus assay is intended as an aid in the diagnosis of influenza from Nasopharyngeal swab specimens and should not be used as a sole basis for treatment. Nasal washings and aspirates are unacceptable for Xpert Xpress SARS-CoV-2/FLU/RSV testing.  Fact Sheet for Patients: BloggerCourse.com  Fact Sheet for Healthcare Providers: SeriousBroker.it  This test is not yet approved or cleared by the [place] FDA and has been authorized for detection and/or diagnosis of SARS-CoV-2 by FDA under an Emergency Use Authorization (EUA). This EUA will remain in effect (meaning this test can be used)  for the duration of the COVID-19 declaration under Section 564(b)(1) of the Act, 21 U.S.C. section 360bbb-3(b)(1), unless the authorization is terminated or revoked.  Performed at [facility], [address], [place], [place] [address]   I-stat chem 8, ed     Status: Abnormal   Collection Time: 12/12/20  6:07 PM  Result Value Ref Range   Sodium 141 135 - 145 mmol/L   Potassium 4.0 3.5 - 5.1 mmol/L   Chloride 107 98 - 111 mmol/L   BUN 30 (H) 6 - 20 mg/dL   Creatinine, Ser 2.13 (H) 0.61 - 1.24 mg/dL  Glucose, Bld 105 (H) 70 - 99 mg/dL    Comment: Glucose reference range applies only to samples taken after fasting for at least 8 hours.   Calcium, Ion 1.12 (L) 1.15 - 1.40 mmol/L   TCO2 23 22 - 32 mmol/L   Hemoglobin 12.9 (L) 13.0 - 17.0 g/dL   HCT 21.338.0 (L) 08.639.0 - 57.852.0 %  HIV Antibody (routine testing w rflx)     Status: None   Collection Time: 12/12/20 10:07 PM  Result Value Ref Range   HIV Screen 4th Generation wRfx Non Reactive Non Reactive    Comment: Performed at [facility], [address] [place], [place] [address]  Creatinine, serum     Status: None   Collection Time: 12/12/20 10:07 PM  Result Value Ref Range   Creatinine, Ser 1.22 0.61 - 1.24 mg/dL   GFR, Estimated >95>60 >28>60 mL/min    Comment: (NOTE) Calculated using the CKD-EPI Creatinine Equation (2021) Performed at [facility], [address] [place], [place] [address]   CBC     Status: Abnormal   Collection Time: 12/13/20  1:50 AM  Result Value Ref Range   WBC 7.3 4.0 - 10.5 K/uL   RBC 3.38 (L) 4.22 - 5.81 MIL/uL   Hemoglobin 10.5 (L) 13.0 - 17.0 g/dL   HCT 40.130.9 (L) 02.739.0 - 25.352.0 %   MCV 91.4 80.0 - 100.0 fL   MCH 31.1 26.0 - 34.0 pg   MCHC 34.0 30.0 - 36.0 g/dL   RDW 66.412.4 40.311.5 - 47.415.5 %   Platelets 171 150 - 400 K/uL   nRBC 0.0 0.0 - 0.2 %    Comment: Performed at [facility], [address] [place], [place] [address]  Comprehensive metabolic panel     Status: Abnormal    Collection Time: 12/13/20  1:50 AM  Result Value Ref Range   Sodium 138 135 - 145 mmol/L   Potassium 4.1 3.5 - 5.1 mmol/L   Chloride 105 98 - 111 mmol/L   CO2 24 22 - 32 mmol/L   Glucose, Bld 114 (H) 70 - 99 mg/dL    Comment: Glucose reference range applies only to samples taken after fasting for at least 8 hours.   BUN 23 (H) 6 - 20 mg/dL   Creatinine, Ser 3.871.14 0.61 - 1.24 mg/dL   Calcium 8.5 (L) 8.9 - 10.3 mg/dL   Total Protein 5.7 (L) 6.5 - 8.1 g/dL   Albumin 3.3 (L) 3.5 - 5.0 g/dL   AST 65 (H) 15 - 41 U/L   ALT 32 0 - 44 U/L   Alkaline Phosphatase 55 38 - 126 U/L   Total Bilirubin 2.2 (H) 0.3 - 1.2 mg/dL   GFR, Estimated >56>60 >43>60 mL/min    Comment: (NOTE) Calculated using the CKD-EPI Creatinine Equation (2021)    Anion gap 9 5 - 15    Comment: Performed at [facility], [address] [place], [place] [address]  ABO/Rh     Status: None   Collection Time: 12/13/20  1:50 AM  Result Value Ref Range   ABO/RH(D)      O POS Performed at [facility], [address] [place], [place] [address]   Surgical pcr screen     Status: None   Collection Time: 12/13/20  2:28 AM   Specimen: Nasal Mucosa; Nasal Swab  Result Value Ref Range   MRSA, PCR NEGATIVE NEGATIVE   Staphylococcus aureus NEGATIVE NEGATIVE  Comment: (NOTE) The Xpert SA Assay (FDA approved for NASAL specimens in patients 72 years of age and older), is one component of a comprehensive surveillance program. It is not intended to diagnose infection nor to guide or monitor treatment. Performed at [facility], [address], [place], [place] [address]     Radiology/Results: DG Knee 2 Views Right  Result Date: 12/12/2020 CLINICAL DATA:  Larey Seat off ladder EXAM: RIGHT KNEE - 1-2 VIEW COMPARISON:  None. FINDINGS: Highly comminuted fracture involving proximal metaphysis of the tibia with about 1/2 shaft diameter medial and posterior displacement of distal fracture fragment. Laterally and anteriorly  displaced tibial tuberosity fracture fragment. No definite articular surface extension. Comminuted displaced and overriding proximal fibular shaft fracture IMPRESSION: 1. Comminuted and displaced proximal tibia fracture 2. Comminuted, displaced and overriding proximal fibular fracture Electronically Signed   By: Jasmine Pang M.D.   On: 12/12/2020 19:27   DG Tibia/Fibula Right  Result Date: 12/13/2020 CLINICAL DATA:  Open reduction internal fixation of tibial fracture. EXAM: RIGHT TIBIA AND FIBULA - 2 VIEW; DG C-ARM 1-60 MIN COMPARISON:  Preoperative imaging. FINDINGS: Five fluoroscopic spot views of the right tibia and fibula obtained in the operating room. Lateral plate and multi screw fixation of comminuted proximal tibial fracture. Fibular fracture is again seen. Total fluoroscopy time 1 minutes 42 seconds. Total dose 5.76 mGy. IMPRESSION: Intraoperative fluoroscopy during right tibia fracture ORIF. Electronically Signed   By: Narda Rutherford M.D.   On: 12/13/2020 15:00   DG Tibia/Fibula Right  Result Date: 12/12/2020 CLINICAL DATA:  Larey Seat off ladder EXAM: RIGHT TIBIA AND FIBULA - 2 VIEW COMPARISON:  None. FINDINGS: Acute comminuted fracture involving the proximal shaft of the fibula with about 1 shaft diameter medial and posterior displacement of distal fracture fragment with about 14 mm of overriding. Comminuted proximal tibial fracture with about 1/2 shaft diameter medial displacement of distal fracture fragment. Lateral and anterior displacement of tibial tuberosity fracture fragment. No definite articular surface extension on these views. IMPRESSION: 1. Comminuted and displaced proximal tibia fracture 2. Comminuted, displaced and overriding proximal fibular fracture Electronically Signed   By: Jasmine Pang M.D.   On: 12/12/2020 19:26   CT Head Wo Contrast  Result Date: 12/12/2020 CLINICAL DATA:  Head trauma, mod-severe Fall from 15 foot ladder EXAM: CT HEAD WITHOUT CONTRAST TECHNIQUE:  Contiguous axial images were obtained from the base of the skull through the vertex without intravenous contrast. COMPARISON:  None. FINDINGS: Brain: No intracranial hemorrhage, mass effect, or midline shift. No hydrocephalus. The basilar cisterns are patent. No evidence of territorial infarct or acute ischemia. No extra-axial or intracranial fluid collection. Vascular: No hyperdense vessel or unexpected calcification. Skull: No fracture or focal lesion. Sinuses/Orbits: Mucosal thickening and scattered mucous retention cysts throughout the paranasal sinuses. No evidence of fracture. The mastoid air cells are clear. Unremarkable orbits. Other: None. IMPRESSION: 1. No acute intracranial abnormality. No skull fracture. 2. Paranasal sinus disease. Electronically Signed   By: Narda Rutherford M.D.   On: 12/12/2020 19:18   CT Cervical Spine Wo Contrast  Result Date: 12/12/2020 CLINICAL DATA:  Head trauma, fall 15 feet from ladder. EXAM: CT CERVICAL SPINE WITHOUT CONTRAST TECHNIQUE: Multidetector CT imaging of the cervical spine was performed without intravenous contrast. Multiplanar CT image reconstructions were also generated. COMPARISON:  None. FINDINGS: Alignment: Normal. Skull base and vertebrae: No acute fracture. Vertebral body heights are maintained. The dens and skull base are intact. Non fusion posterior arch of C1, variant anatomy.  There is also an accessory ossicle above the anterior arch of C1. Soft tissues and spinal canal: No prevertebral fluid or swelling. No visible canal hematoma. Disc levels:  Disc spaces are preserved. Upper chest: Assessed on concurrent chest CT, reported separately. Other: None. IMPRESSION: No fracture or subluxation of the cervical spine. Electronically Signed   By: Narda Rutherford M.D.   On: 12/12/2020 19:23   DG Pelvis Portable  Result Date: 12/12/2020 CLINICAL DATA:  Fall from ladder, right leg deformity EXAM: PORTABLE PELVIS 1-2 VIEWS COMPARISON:  None. FINDINGS: Patient  is rotated. There is no evidence of pelvic fracture or diastasis. No pelvic bone lesions are seen. IMPRESSION: Negative. Electronically Signed   By: Duanne Guess D.O.   On: 12/12/2020 18:27   CT TIBIA FIBULA RIGHT WO CONTRAST  Result Date: 12/12/2020 CLINICAL DATA:  Right proximal tibial and fibular fracture. EXAM: CT OF THE LOWER RIGHT EXTREMITY WITHOUT CONTRAST TECHNIQUE: Multidetector CT imaging of the right lower extremity was performed according to the standard protocol. COMPARISON:  Radiographs earlier today FINDINGS: Bones/Joint/Cartilage Comminuted and displaced proximal tibial metaphyseal fracture. Dominant fracture plane is transversely oriented. Greatest osseous distraction of 15-18 mm. There is no extension to the articular surface or knee joint. Transverse proximal fibular shaft fracture has osseous overriding of 4-5 mm. Nondisplaced extension involves the anterior fibular cortex and courses cranially, fracture terminating just before the proximal tibia/fibular articulation. No fracture of the distal tibia or fibula. Ankle alignment is maintained. Ankle mortise is preserved. There is no knee or ankle joint effusion. No fracture of the included distal femur or patella. Ligaments Suboptimally assessed by CT. Muscles and Tendons Heterogeneous enlargement of the posterior cuff muscle compartment likely due to intramuscular hematoma. Intact Achilles tendon. Soft tissues Subcutaneous soft tissue edema and hematoma at the fracture site. There is also hematoma about the medial aspect of the knee and distal thigh. IMPRESSION: 1. Comminuted and displaced proximal tibial metaphyseal fracture. Dominant fracture plane is transversely oriented. No extension to the articular surface or knee joint. 2. Transverse proximal fibular shaft fracture has osseous overriding of 4-5 mm. Nondisplaced extension involves the anterior fibular cortex and courses cranially, fracture terminating just before the proximal  tibia/fibular articulation. 3. Heterogeneous enlargement of the posterior cuff muscle compartment likely due to intramuscular hematoma. Soft tissue hematoma about the medial aspect of the lower leg and knee. Electronically Signed   By: Narda Rutherford M.D.   On: 12/12/2020 22:13   CT CHEST ABDOMEN PELVIS W CONTRAST  Result Date: 12/12/2020 CLINICAL DATA:  Larey Seat from 15 foot ladder EXAM: CT CHEST, ABDOMEN, AND PELVIS WITH CONTRAST TECHNIQUE: Multidetector CT imaging of the chest, abdomen and pelvis was performed following the standard protocol during bolus administration of intravenous contrast. CONTRAST:  OMNIPAQUE IOHEXOL 300 MG/ML  SOLN COMPARISON:  None. FINDINGS: CT CHEST FINDINGS Cardiovascular: The heart and great vessels are unremarkable without pericardial effusion. No evidence of vascular injury. Mediastinum/Nodes: No enlarged mediastinal, hilar, or axillary lymph nodes. Thyroid gland, trachea, and esophagus demonstrate no significant findings. Lungs/Pleura: No acute airspace disease, effusion, or pneumothorax. The central airways are patent. Musculoskeletal: No acute or destructive bony lesions. Reconstructed images demonstrate no additional findings. CT ABDOMEN PELVIS FINDINGS Hepatobiliary: No hepatic injury or perihepatic hematoma. Gallbladder is unremarkable Pancreas: Unremarkable. No pancreatic ductal dilatation or surrounding inflammatory changes. Spleen: No splenic injury or perisplenic hematoma. Adrenals/Urinary Tract: No adrenal hemorrhage or renal injury identified. Bladder is unremarkable. Stomach/Bowel: No bowel obstruction or ileus. Normal appendix right lower quadrant.  No bowel wall thickening or inflammatory change. Vascular/Lymphatic: No significant vascular findings. No evidence of vascular injury. No pathologic adenopathy. Reproductive: Prostate is unremarkable. Other: There is asymmetric enlargement of the left paraspinous musculature and left psoas muscle consistent with  intramuscular hematoma. Left-sided retroperitoneal fluid is identified consistent with hemorrhage after trauma. I do not see any active contrast extravasation. No free intraperitoneal fluid or free gas. Small fat containing left inguinal hernia. Musculoskeletal: There are displaced left transverse process fractures at L1, L2, L3, and L4. No other acute displaced fractures. Reconstructed images demonstrate no additional findings. IMPRESSION: 1. Transverse process fractures on the left at L1, L2, L3, and L4. 2. Small volume left-sided retroperitoneal hemorrhage, with intramuscular hematoma within the left paraspinous musculature and left psoas muscle. No evidence of contrast extravasation to suggest active hemorrhage. 3. No acute intrathoracic trauma. Critical Value/emergent results were called by telephone at the time of interpretation on 12/12/2020 at 7:28 pm to provider [facility] , who verbally acknowledged these results. Electronically Signed   By: Sharlet Salina M.D.   On: 12/12/2020 19:31   DG Chest Port 1 View  Result Date: 12/12/2020 CLINICAL DATA:  Fall from ladder EXAM: PORTABLE CHEST 1 VIEW COMPARISON:  None. FINDINGS: The heart size and mediastinal contours are within normal limits. Both lungs are clear. No pneumothorax. The visualized skeletal structures are unremarkable. IMPRESSION: No acute process in the chest. Electronically Signed   By: Duanne Guess D.O.   On: 12/12/2020 18:28   DG Foot 2 Views Right  Result Date: 12/12/2020 CLINICAL DATA:  Larey Seat from ladder EXAM: RIGHT FOOT - 2 VIEW COMPARISON:  None. FINDINGS: Single lateral view of the foot shows no definitive fracture or malalignment. IMPRESSION: Negative. Electronically Signed   By: Jasmine Pang M.D.   On: 12/12/2020 19:27   DG C-Arm 1-60 Min  Result Date: 12/13/2020 CLINICAL DATA:  Open reduction internal fixation of tibial fracture. EXAM: RIGHT TIBIA AND FIBULA - 2 VIEW; DG C-ARM 1-60 MIN COMPARISON:  Preoperative imaging.  FINDINGS: Five fluoroscopic spot views of the right tibia and fibula obtained in the operating room. Lateral plate and multi screw fixation of comminuted proximal tibial fracture. Fibular fracture is again seen. Total fluoroscopy time 1 minutes 42 seconds. Total dose 5.76 mGy. IMPRESSION: Intraoperative fluoroscopy during right tibia fracture ORIF. Electronically Signed   By: Narda Rutherford M.D.   On: 12/13/2020 15:00    Anti-infectives: Anti-infectives (From admission, onward)    Start     Dose/Rate Route Frequency Ordered Stop   12/13/20 1800  ceFAZolin (ANCEF) IVPB 2g/100 mL premix        2 g 200 mL/hr over 30 Minutes Intravenous Every 6 hours 12/13/20 1527 12/14/20 0549   12/13/20 0800  ceFAZolin (ANCEF) IVPB 2g/100 mL premix  Status:  Discontinued        2 g 200 mL/hr over 30 Minutes Intravenous On call to O.R. 12/13/20 0138 12/13/20 1516       Assessment/Plan: Problem List: Patient Active Problem List   Diagnosis Date Noted   Fall from ladder 12/12/2020   Tibia/fibula fracture, right, closed, initial encounter 12/12/2020   Lumbar transverse process fracture (HCC) 12/12/2020   Acute pain due to trauma 12/12/2020   Hyperbilirubinemia 12/12/2020    Stable after ortho surgery to right lower extremity 1 Day Post-Op    LOS: 2 days   Matt B. Daphine Deutscher, MD, [facility] [phone] to reach the surgeon on call.    12/14/2020  8:28 AM

## 2020-12-14 NOTE — Evaluation (Signed)
Physical Therapy Evaluation Patient Details Name: Andrew Esparza MRN: [id number] DOB: May 24, 1986 Today's Date: 12/14/2020   History of Present Illness  Pt is a 35 yo male who presented to the ED as a level 2 trauma after a fall from a ladder. He was about 15-20 feet off the ground. He denies loss of consciousness. He complains primarily of right leg pain. Initial imaging workup showed lumbar transverse process fractures and a right tib-fib fracture. He also has a paraspinal muscle hematoma without active extravasation. S/p ORIF R tibia on 6/18; NWB  Clinical Impression   Patient is s/p above surgery resulting in functional limitations due to the deficits listed below (see PT Problem List). Lives at home with spuose and 4 children ages 2-11yo; independent at baseline; Presents to PT with RLE pain effecting his functional mobility, but overall moving well and keeping good NWB with RW; Plan for stairs next session (will consider backwards with RW and min assist versus crutches and /or crutch and rail; Patient will benefit from skilled PT to increase their independence and safety with mobility to allow discharge to the venue listed below.       Follow Up Recommendations Outpatient PT (The potential need for Outpatient PT can be addressed at Ortho follow-up appointments.)    Equipment Recommendations  Rolling walker with 5" wheels;3in1 (PT);Crutches    Recommendations for Other Services       Precautions / Restrictions Precautions Precautions: Fall Precaution Comments: Fall risk greatly reduced with use of RW Required Braces or Orthoses: Knee Immobilizer - Right (Pt had KI on RLE upon entry; No order in the chart for ROM restrictions; contacted [place], [place] re: getting clarification) Restrictions RLE Weight Bearing: Non weight bearing      Mobility  Bed Mobility Overal bed mobility: Needs Assistance Bed Mobility: Supine to Sit     Supine to sit: Min assist     General bed  mobility comments: Cues for technique, and min assist to support RLE coming off of the bed    Transfers Overall transfer level: Needs assistance Equipment used: Rolling walker (2 wheeled) Transfers: Sit to/from Stand Sit to Stand: Min guard         General transfer comment: Good rise on LLE  Ambulation/Gait Ambulation/Gait assistance: Min guard Gait Distance (Feet): 12 Feet Assistive device: Rolling walker (2 wheeled) Gait Pattern/deviations: Step-to pattern (swing-to-type pattern on LLE)     General Gait Details: Good use of RW for support and maintaining NWB well; slow steps, and noted very tense -- indicated he was tense for this first time up  Stairs            Wheelchair Mobility    Modified Rankin (Stroke Patients Only)       Balance Overall balance assessment: Modified Independent                                           Pertinent Vitals/Pain Pain Assessment: 0-10 Pain Score: 5  Pain Location: R knee and lower leg Pain Descriptors / Indicators: Aching;Constant Pain Intervention(s): Monitored during session;Repositioned    Home Living Family/patient expects to be discharged to:: Private residence Living Arrangements: Spouse/significant other;Children Available Help at Discharge: Family Type of Home: Mobile home Home Access: Stairs to enter Entrance Stairs-Rails: Right;Left (far apart; must choose one) Entrance Stairs-Number of Steps: 5 Home Layout: One level Home Equipment: None  Prior Function Level of Independence: Independent         Comments: Father of 4 kids, ages range 2-11yo     Hand Dominance        Extremity/Trunk Assessment   Upper Extremity Assessment Upper Extremity Assessment: Overall WFL for tasks assessed    Lower Extremity Assessment Lower Extremity Assessment: RLE deficits/detail RLE Deficits / Details: R knee in KI (contacted [place], Orth[facility] PA for clarification if KI is needed); positive toe  wiggle and sensation intact to light touch; Hesitant to move rLE due to anticipation of pain RLE: Unable to fully assess due to immobilization    Cervical / Trunk Assessment Cervical / Trunk Assessment: Normal  Communication   Communication: Prefers language other than [place] ([place]; [place], Environmental health practitioner [phone] facilitated communication)  Cognition Arousal/Alertness: Awake/alert Behavior During Therapy: WFL for tasks assessed/performed Overall Cognitive Status: Within Functional Limits for tasks assessed                                        General Comments General comments (skin integrity, edema, etc.): Became tearful when talking about his family    Exercises     Assessment/Plan    PT Assessment Patient needs continued PT services  PT Problem List Decreased range of motion;Decreased activity tolerance;Decreased mobility;Decreased knowledge of use of DME;Decreased skin integrity       PT Treatment Interventions DME instruction;Gait training;Stair training;Functional mobility training;Therapeutic activities;Therapeutic exercise;Balance training;Patient/family education    PT Goals (Current goals can be found in the Care Plan section)  Acute Rehab PT Goals Patient Stated Goal: Home soon; get his LE healed and well PT Goal Formulation: With patient Time For Goal Achievement: 12/21/20 Potential to Achieve Goals: Good    Frequency Min 6X/week   Barriers to discharge        Co-evaluation               AM-PAC PT "6 Clicks" Mobility  Outcome Measure Help needed turning from your back to your side while in a flat bed without using bedrails?: A Little Help needed moving from lying on your back to sitting on the side of a flat bed without using bedrails?: A Little Help needed moving to and from a bed to a chair (including a wheelchair)?: None Help needed standing up from a chair using your arms (e.g., wheelchair or bedside chair)?:  None Help needed to walk in hospital room?: A Little Help needed climbing 3-5 steps with a railing? : A Little 6 Click Score: 20    End of Session Equipment Utilized During Treatment: Gait belt;Right knee immobilizer Activity Tolerance: Patient tolerated treatment well Patient left: in chair;with call bell/phone within reach;with chair alarm set Nurse Communication: Mobility status PT Visit Diagnosis: Other abnormalities of gait and mobility (R26.89);Pain Pain - Right/Left: Right Pain - part of body: Leg    Time: 6712-4580 PT Time Calculation (min) (ACUTE ONLY): 35 min   Charges:   PT Evaluation $PT Eval Low Complexity: 1 Low PT Treatments $Gait Training: 8-22 mins        Van Clines, PT  Acute Rehabilitation Services Pager [phone] Office [phone]   Levi Aland 12/14/2020, 10:57 AM

## 2020-12-15 ENCOUNTER — Other Ambulatory Visit (HOSPITAL_COMMUNITY): Payer: Self-pay

## 2020-12-15 LAB — CBC
HCT: 24.7 % — ABNORMAL LOW (ref 39.0–52.0)
Hemoglobin: 8.5 g/dL — ABNORMAL LOW (ref 13.0–17.0)
MCH: 31.5 pg (ref 26.0–34.0)
MCHC: 34.4 g/dL (ref 30.0–36.0)
MCV: 91.5 fL (ref 80.0–100.0)
Platelets: 145 10*3/uL — ABNORMAL LOW (ref 150–400)
RBC: 2.7 MIL/uL — ABNORMAL LOW (ref 4.22–5.81)
RDW: 12 % (ref 11.5–15.5)
WBC: 6.2 10*3/uL (ref 4.0–10.5)
nRBC: 0 % (ref 0.0–0.2)

## 2020-12-15 MED ORDER — METHOCARBAMOL 500 MG PO TABS
500.0000 mg | ORAL_TABLET | Freq: Three times a day (TID) | ORAL | 0 refills | Status: AC | PRN
  Filled 2020-12-15: qty 30, 10d supply, fill #0

## 2020-12-15 MED ORDER — DOCUSATE SODIUM 100 MG PO CAPS
100.0000 mg | ORAL_CAPSULE | Freq: Two times a day (BID) | ORAL | 0 refills | Status: AC
  Filled 2020-12-15: qty 10, 5d supply, fill #0

## 2020-12-15 MED ORDER — ACETAMINOPHEN 325 MG PO TABS
650.0000 mg | ORAL_TABLET | Freq: Four times a day (QID) | ORAL | 0 refills | Status: AC | PRN
  Filled 2020-12-15: qty 60, 8d supply, fill #0

## 2020-12-15 MED ORDER — OXYCODONE HCL 5 MG PO TABS
5.0000 mg | ORAL_TABLET | Freq: Four times a day (QID) | ORAL | 0 refills | Status: AC | PRN
  Filled 2020-12-15: qty 25, 7d supply, fill #0

## 2020-12-15 MED ORDER — POLYETHYLENE GLYCOL 3350 17 GM/SCOOP PO POWD
17.0000 g | Freq: Every day | ORAL | 0 refills | Status: AC | PRN
  Filled 2020-12-15: qty 510, 30d supply, fill #0

## 2020-12-15 NOTE — Progress Notes (Signed)
Discharge instructions given to patient using interpreter Graciela. Patient verbalized understanding of the teaching. Patient currently ready for discharge just waiting on bedside commode and walker to be delivered. Patient also has his ride bringing him some clothes to go home in .

## 2020-12-15 NOTE — Progress Notes (Signed)
Physical Therapy Treatment Patient Details Name: Andrew Esparza MRN: [id number] DOB: October 25, 1985 Today's Date: 12/15/2020    History of Present Illness Pt is a 35 yo male who presented to the ED as a level 2 trauma after a fall from a ladder. He was about 15-20 feet off the ground. He denies loss of consciousness. He complains primarily of right leg pain. Initial imaging workup showed lumbar transverse process fractures and a right tib-fib fracture. He also has a paraspinal muscle hematoma without active extravasation. S/p ORIF R tibia on 6/18; NWB    PT Comments    Focused on stair training today and progressing to trial of stairs to prepare for return home.  PTA used in house interpreter for session.  Pt tolerated stair training well.     Follow Up Recommendations  Outpatient PT     Equipment Recommendations  Rolling walker with 5" wheels;3in1 (PT);Crutches    Recommendations for Other Services       Precautions / Restrictions Precautions Precautions: Fall Precaution Comments: Fall risk greatly reduced with use of RW Required Braces or Orthoses: Knee Immobilizer - Right Restrictions Weight Bearing Restrictions: Yes RLE Weight Bearing: Non weight bearing    Mobility  Bed Mobility Overal bed mobility: Needs Assistance Bed Mobility: Supine to Sit;Sit to Supine     Supine to sit: Min assist     General bed mobility comments: min A with R LE to EOB and off EOB    Transfers Overall transfer level: Needs assistance Equipment used: Rolling walker (2 wheeled) Transfers: Sit to/from Stand Sit to Stand: Min guard         General transfer comment: Good rise on LLE  Ambulation/Gait Ambulation/Gait assistance: Min guard Gait Distance (Feet): 55 Feet Assistive device: Rolling walker (2 wheeled) Gait Pattern/deviations: Step-to pattern     General Gait Details: Good use of RW for support and maintaining NWB well   Stairs Stairs: Yes Stairs assistance: Min  assist Stair Management: No rails;Backwards;With walker Number of Stairs: 3 General stair comments: Cues for sequencing and safety.  Cues for RW placement.   Wheelchair Mobility    Modified Rankin (Stroke Patients Only)       Balance Overall balance assessment: Modified Independent                                          Cognition Arousal/Alertness: Awake/alert Behavior During Therapy: WFL for tasks assessed/performed Overall Cognitive Status: Within Functional Limits for tasks assessed                                        Exercises      General Comments        Pertinent Vitals/Pain Pain Assessment: Faces Faces Pain Scale: Hurts little more Pain Location: R knee and lower leg Pain Descriptors / Indicators: Aching;Sore;Grimacing Pain Intervention(s): Monitored during session;Repositioned    Home Living Family/patient expects to be discharged to:: Private residence Living Arrangements: Spouse/significant other;Children Available Help at Discharge: Family Type of Home: Mobile home Home Access: Stairs to enter Entrance Stairs-Rails: Right;Left Home Layout: One level Home Equipment: None      Prior Function Level of Independence: Independent      Comments: Father of 4 kids, ages range 2-11yo   PT Goals (current goals can now be  found in the care plan section) Acute Rehab PT Goals Patient Stated Goal: Home soon; get his LE healed and well Potential to Achieve Goals: Good Progress towards PT goals: Progressing toward goals    Frequency    Min 6X/week      PT Plan Current plan remains appropriate    Co-evaluation              AM-PAC PT "6 Clicks" Mobility   Outcome Measure  Help needed turning from your back to your side while in a flat bed without using bedrails?: A Little Help needed moving from lying on your back to sitting on the side of a flat bed without using bedrails?: A Little Help needed moving to  and from a bed to a chair (including a wheelchair)?: A Little Help needed standing up from a chair using your arms (e.g., wheelchair or bedside chair)?: A Little Help needed to walk in hospital room?: A Little Help needed climbing 3-5 steps with a railing? : A Little 6 Click Score: 18    End of Session Equipment Utilized During Treatment: Gait belt;Right knee immobilizer Activity Tolerance: Patient tolerated treatment well Patient left: in chair;with call bell/phone within reach Nurse Communication: Mobility status PT Visit Diagnosis: Other abnormalities of gait and mobility (R26.89);Pain Pain - Right/Left: Right Pain - part of body: Leg     Time: 9323-5573 PT Time Calculation (min) (ACUTE ONLY): 17 min  Charges:  $Gait Training: 8-22 mins                     Bonney Leitz , PTA Acute Rehabilitation Services Pager [phone] Office [phone]    Moesha Sarchet Artis Delay 12/15/2020, 5:02 PM

## 2020-12-15 NOTE — Discharge Instructions (Signed)
Diet: As you were doing prior to hospitalization   Shower:  May shower but keep the wounds dry, use an occlusive plastic wrap, NO SOAKING IN TUB.  If the bandage gets wet, change with a clean dry gauze.    Dressing:  You may change your dressing 3-5 days after surgery, unless you have a splint.    If you had hand or foot surgery, we will plan to remove your stitches in about 2 weeks in the office.  For all other surgeries, there are sticky tapes (steri-strips) on your wounds and all the stitches are absorbable.  Leave the steri-strips in place when changing your dressings, they will peel off with time, usually 2-3 weeks.  Activity:  Increase activity slowly as tolerated, but follow the weight bearing instructions below.  The rules on driving is that you can not be taking narcotics while you drive, and you must feel in control of the vehicle.    Weight Bearing:   non weight bearing with right leg, maintain knee immobilizer at all times  To prevent constipation: you may use a stool softener such as -  Colace (over the counter) 100 mg by mouth twice a day  Drink plenty of fluids (prune juice may be helpful) and high fiber foods Miralax (over the counter) for constipation as needed.    Itching:  If you experience itching with your medications, try taking only a single pain pill, or even half a pain pill at a time.  You may take up to 10 pain pills per day, and you can also use benadryl over the counter for itching or also to help with sleep.   Precautions:  If you experience chest pain or shortness of breath - call 911 immediately for transfer to the hospital emergency department!!  If you develop a fever greater that 101 F, purulent drainage from wound, increased redness or drainage from wound, or calf pain -- Call the office at [phone]                                                Follow- Up Appointment:  Please call for an appointment to be seen in 2 weeks [place] - [phone]

## 2020-12-15 NOTE — Progress Notes (Addendum)
     Subjective: 2 Days Post-Op s/p Procedure(s): OPEN REDUCTION INTERNAL FIXATION (ORIF) TIBIA FRACTURE   Patient is alert, oriented, yes  Patient reports pain as mild.   Denies chest pain, SOB, Calf pain. No nausea/vomiting. No other complaints.     Objective:  PE: VITALS:   Vitals:   12/13/20 2301 12/14/20 0356 12/14/20 1509 12/14/20 2028  BP: 113/70 103/62 114/72 133/74  Pulse: 87 83 96 97  Resp: 18 18 17 18   Temp: 99.6 F (37.6 C) 98.3 F (36.8 C) 99.6 F (37.6 C) (!) 100.7 F (38.2 C)  TempSrc: Oral Oral Oral Oral  SpO2: 100% 96% 99% 96%  Weight:      Height:        Neurovascular intact Sensation intact distally Intact pulses distally Dorsiflexion/Plantar flexion intact Incision: moderate drainage Compartments compressible  LABS  No results found for this or any previous visit (from the past 24 hour(s)).  DG Tibia/Fibula Right  Result Date: 12/13/2020 CLINICAL DATA:  Open reduction internal fixation of tibial fracture. EXAM: RIGHT TIBIA AND FIBULA - 2 VIEW; DG C-ARM 1-60 MIN COMPARISON:  Preoperative imaging. FINDINGS: Five fluoroscopic spot views of the right tibia and fibula obtained in the operating room. Lateral plate and multi screw fixation of comminuted proximal tibial fracture. Fibular fracture is again seen. Total fluoroscopy time 1 minutes 42 seconds. Total dose 5.76 mGy. IMPRESSION: Intraoperative fluoroscopy during right tibia fracture ORIF. Electronically Signed   By: 12/15/2020 M.D.   On: 12/13/2020 15:00   DG C-Arm 1-60 Min  Result Date: 12/13/2020 CLINICAL DATA:  Open reduction internal fixation of tibial fracture. EXAM: RIGHT TIBIA AND FIBULA - 2 VIEW; DG C-ARM 1-60 MIN COMPARISON:  Preoperative imaging. FINDINGS: Five fluoroscopic spot views of the right tibia and fibula obtained in the operating room. Lateral plate and multi screw fixation of comminuted proximal tibial fracture. Fibular fracture is again seen. Total fluoroscopy time 1  minutes 42 seconds. Total dose 5.76 mGy. IMPRESSION: Intraoperative fluoroscopy during right tibia fracture ORIF. Electronically Signed   By: 12/15/2020 M.D.   On: 12/13/2020 15:00    Assessment/Plan: Principal Problem:   Tibia/fibula fracture, right, closed, initial encounter Active Problems:   Fall from ladder   Lumbar transverse process fracture (HCC)   Acute pain due to trauma   Hyperbilirubinemia   2 Days Post-Op s/p Procedure(s): OPEN REDUCTION INTERNAL FIXATION (ORIF) TIBIA FRACTURE  Weightbearing: NWB RLE Follow - up plan: 2 weeks with Dr. 12/15/2020 Dispo: Okay to discharge home today from ortho standpoint  Contact information:   Weekdays 8-5 03-01-2006, PA-C [phone] A fter hours and holidays please check [email or web address] for group call information for [facility]  [phone] 12/15/2020, 1:01 AM

## 2020-12-15 NOTE — TOC Transition Note (Signed)
Transition of Care [facility]) - CM/SW Discharge Note   Patient Details  Name: Andrew Esparza MRN: [id number] Date of Birth: [birth year]  [facility]) CM/SW Contact:  Glennon Mac, RN Phone Number: 12/15/2020, 3:48 PM   Clinical Narrative:   Pt is a 35 yo male who presented to the ED as a level 2 trauma after a fall from a ladder. He was about 15-20 feet off the ground. He denies loss of consciousness. He complains primarily of right leg pain. Initial imaging workup showed lumbar transverse process fractures and a right tib-fib fracture. He also has a paraspinal muscle hematoma without active extravasation. Prior to admission, patient independent and living at home with his wife and 4 small children.  PT recommending outpatient follow-up, and referral made to [facility].  Referral to [employer] for recommended DME; rolling walker and 3 in 1 bedside commode to be delivered to bedside prior to discharge. Patient is uninsured, but is eligible for medication assistance through [facility] program; discharge prescriptions sent to [facility] pharmacy to be filled using MATCH letter.    Final next level of care: OP Rehab Barriers to Discharge: Barriers Resolved   Patient Goals and CMS Choice Patient states their goals for this hospitalization and ongoing recovery are:: to go home                            Discharge Plan and Services   Discharge Planning Services: CM Consult, Medication Assistance, MATCH Program            DME Arranged: 3-N-1, Walker rolling DME Agency: [employer] Date DME Agency Contacted: 12/15/20 Time DME Agency Contacted: 1510 Representative spoke with at DME Agency: Velna Hatchet            Social Determinants of Health (SDOH) Interventions     Readmission Risk Interventions No flowsheet data found.  Quintella Baton, RN, BSN  Trauma/Neuro ICU Case Manager [phone]

## 2020-12-15 NOTE — Discharge Summary (Signed)
[facility] Discharge Summary   Patient ID: Andrew Esparza MRN: [id number] DOB/AGE: 1985-10-18 35 y.o.  Admit date: 12/12/2020 Discharge date: 12/15/2020  Discharge Diagnosis Patient Active Problem List   Diagnosis Date Noted   Fall from ladder 12/12/2020   Tibia/fibula fracture, right, closed, initial encounter 12/12/2020   Lumbar transverse process fracture (HCC) 12/12/2020   Acute pain due to trauma 12/12/2020   Hyperbilirubinemia 12/12/2020    Consultants Orthopedic surgery   Procedures Dr. Dion Saucier - ORIF right tibia fracture (12/13/20)   Hospital Course:  HPI:  Mr. Andrew Esparza is a 35 yo male who presented to the ED as a level 2 trauma after a fall from a ladder. He was about 15-20 feet off the ground. He denies loss of consciousness. He complains primarily of right leg pain. Initial imaging workup showed lumbar transverse process fractures and a right tib-fib fracture. He also has a paraspinal muscle hematoma without active extravasation. He has been hemodynamically stable since arrival to the ED. Patient was admitted and underwent procedure listed above.  Tolerated procedure well and was transferred to the floor.  Post-operatively he worked with PT and OT who recommend outpatient PT. Diet was advanced as tolerated.  On 12/15/20 the patient was voiding well, tolerating diet, ambulating well, pain well controlled, vital signs stable, incisions c/d/i and felt stable for discharge home.  Patient will follow up as below and knows to call with questions or concerns.   I have personally reviewed the patients medication history on the [place] controlled substance database.   Physical Exam: General:  Alert, NAD, pleasant, comfortable CV: RRR, no m/r/g, radial pules 2+ BL Pulm: normal effort on room air, CTAB Abd:  Soft, nontender, nondistended  MSK: RLE splinted with knee immobilizer, foot edematous, WWP, sensation in tact, wiggles toes. Psych: A&Ox3  Allergies as of  12/15/2020   No Known Allergies      Medication List     TAKE these medications    acetaminophen 325 MG tablet Commonly known as: TYLENOL Take 2 tablets (650 mg total) by mouth every 6 (six) hours as needed for mild pain or moderate pain.   docusate sodium 100 MG capsule Commonly known as: COLACE Take 1 capsule (100 mg total) by mouth 2 (two) times daily.   methocarbamol 500 MG tablet Commonly known as: ROBAXIN Take 1 tablet (500 mg total) by mouth every 8 (eight) hours as needed for muscle spasms.   oxyCODONE 5 MG immediate release tablet Commonly known as: Oxy IR/ROXICODONE Take 1 tablet (5 mg total) by mouth every 6 (six) hours as needed (5mg  for moderate pain, 10mg  for severe pain).   polyethylene glycol powder 17 GM/SCOOP powder Commonly known as: GLYCOLAX/MIRALAX Dissolve 1 capful (17 g) in water and drink once daily as needed for mild constipation.               Durable Medical Equipment  (From admission, onward)           Start     Ordered   12/15/20 1451  For home use only DME Walker rolling  Once       Comments: To help patient transfer and ambulate.  Physical / Occupational Therapy may change type of walker PRN.  Question Answer Comment  Walker: With 5 Inch Wheels   Patient needs a walker to treat with the following condition Leg fracture      12/15/20 1450   12/15/20 1450  For home use only DME 3 n 1  Once        12/15/20 1450              Follow-up Information     Teryl Lucy, MD. Schedule an appointment as soon as possible for a visit in 2 week(s).   Specialty: Orthopedic Surgery Contact information: [address] [place] [address] [phone]         [facility] Follow up.   Specialty: Rehabilitation Why: Call for outpatient physical therapy appointment. Contact information: [address] [id number] [place] [address] [phone]                 Signed: Hosie Spangle, [facility] 12/15/2020, 3:01 PM

## 2020-12-15 NOTE — Evaluation (Addendum)
Occupational Therapy Evaluation Patient Details Name: Andrew Esparza MRN: [id number] DOB: 09-27-1985 Today's Date: 12/15/2020    History of Present Illness Pt is a 35 yo male who presented to the ED as a level 2 trauma after a fall from a ladder. He was about 15-20 feet off the ground. He denies loss of consciousness. He complains primarily of right leg pain. Initial imaging workup showed lumbar transverse process fractures and a right tib-fib fracture. He also has a paraspinal muscle hematoma without active extravasation. S/p ORIF R tibia on 6/18; NWB   Clinical Impression   Pt presents with decline in function and safety with ADLs and ADL mobility with impaired balance and endurance. PTA pt lived at home with his wife and children and was Ind with ADLs/selfcare, IADLs and mobility. Pt currently requires min A to sit EOB, mod A with LB selfcare and min guard A with mobility using RW. Pt would benefit from acute OT services to address impairments to maximize level of function and safety    Follow Up Recommendations  Supervision - Intermittent, No follow up OT   Equipment Recommendations  3 in 1 bedside commode;Other (comment) (RW, crutches, reacher)    Recommendations for Other Services       Precautions / Restrictions Precautions Precautions: Fall Precaution Comments: Fall risk greatly reduced with use of RW Required Braces or Orthoses: Knee Immobilizer - Right Restrictions Weight Bearing Restrictions: Yes RLE Weight Bearing: Non weight bearing      Mobility Bed Mobility Overal bed mobility: Needs Assistance Bed Mobility: Supine to Sit     Supine to sit: Min assist     General bed mobility comments: min A with R LE to EOB and off EOB    Transfers Overall transfer level: Needs assistance Equipment used: Rolling walker (2 wheeled) Transfers: Sit to/from Stand Sit to Stand: Min guard              Balance Overall balance assessment: Modified Independent                                          ADL either performed or assessed with clinical judgement   ADL Overall ADL's : Needs assistance/impaired Eating/Feeding: Set up;Independent;Sitting   Grooming: Wash/dry hands;Wash/dry face;Set up;Sitting   Upper Body Bathing: Set up;Sitting   Lower Body Bathing: Sitting/lateral leans;Moderate assistance   Upper Body Dressing : Set up;Sitting   Lower Body Dressing: Moderate assistance;Sitting/lateral leans   Toilet Transfer: Min guard;RW;Stand-pivot;BSC   Toileting- Clothing Manipulation and Hygiene: Moderate assistance       Functional mobility during ADLs: Min guard;Rolling walker;Cueing for safety       Vision Patient Visual Report: No change from baseline       Perception     Praxis      Pertinent Vitals/Pain Pain Assessment: Faces Faces Pain Scale: Hurts little more Pain Location: R knee and lower leg Pain Descriptors / Indicators: Aching;Sore;Grimacing Pain Intervention(s): Monitored during session;Repositioned     Hand Dominance Right   Extremity/Trunk Assessment Upper Extremity Assessment Upper Extremity Assessment: Overall WFL for tasks assessed   Lower Extremity Assessment Lower Extremity Assessment: Defer to PT evaluation   Cervical / Trunk Assessment Cervical / Trunk Assessment: Normal   Communication Communication Communication: Prefers language other than English (speaks some [place])   Cognition Arousal/Alertness: Awake/alert Behavior During Therapy: WFL for tasks assessed/performed Overall Cognitive Status: Within  Functional Limits for tasks assessed                                     General Comments       Exercises     Shoulder Instructions      Home Living Family/patient expects to be discharged to:: Private residence Living Arrangements: Spouse/significant other;Children Available Help at Discharge: Family Type of Home: Mobile home Home Access: Stairs  to enter Secretary/administrator of Steps: 5 Entrance Stairs-Rails: Right;Left Home Layout: One level     Bathroom Shower/Tub: Walk-in Pensions consultant: Standard     Home Equipment: None          Prior Functioning/Environment Level of Independence: Independent        Comments: Father of 4 kids, ages range 2-11yo        OT Problem List: Impaired balance (sitting and/or standing);Pain;Decreased activity tolerance;Decreased knowledge of use of DME or AE      OT Treatment/Interventions: Self-care/ADL training;DME and/or AE instruction;Therapeutic activities;Balance training;Patient/family education    OT Goals(Current goals can be found in the care plan section) Acute Rehab OT Goals Patient Stated Goal: Home soon; get his LE healed and well OT Goal Formulation: With patient Time For Goal Achievement: 12/29/20 Potential to Achieve Goals: Good ADL Goals Pt Will Perform Lower Body Bathing: with min assist;with min guard assist;sitting/lateral leans;with caregiver independent in assisting Pt Will Perform Lower Body Dressing: with min assist;with min guard assist;sitting/lateral leans;with caregiver independent in assisting Pt Will Transfer to Toilet: with supervision;with modified independence;ambulating;stand pivot transfer;bedside commode;regular height toilet Pt Will Perform Toileting - Clothing Manipulation and hygiene: with min assist;with min guard assist;with caregiver independent in assisting;sit to/from stand  OT Frequency: Min 2X/week   Barriers to D/C:            Co-evaluation              AM-PAC OT "6 Clicks" Daily Activity     Outcome Measure Help from another person eating meals?: None Help from another person taking care of personal grooming?: A Little Help from another person toileting, which includes using toliet, bedpan, or urinal?: A Lot Help from another person bathing (including washing, rinsing, drying)?: A Lot Help from  another person to put on and taking off regular upper body clothing?: None Help from another person to put on and taking off regular lower body clothing?: A Lot 6 Click Score: 17   End of Session Equipment Utilized During Treatment: Gait belt;Rolling walker;Other (comment) (BSC)  Activity Tolerance: Patient tolerated treatment well Patient left: in chair;with call bell/phone within reach  OT Visit Diagnosis: Other abnormalities of gait and mobility (R26.89);History of falling (Z91.81);Pain Pain - Right/Left: Right Pain - part of body: Leg                Time: 6767-2094 OT Time Calculation (min): 26 min Charges:  OT General Charges $OT Visit: 1 Visit OT Evaluation $OT Eval Low Complexity: 1 Low OT Treatments $Self Care/Home Management : 8-22 mins    Galen Manila 12/15/2020, 2:40 PM

## 2020-12-16 ENCOUNTER — Encounter (HOSPITAL_COMMUNITY): Payer: Self-pay | Admitting: Orthopedic Surgery

## 2020-12-26 ENCOUNTER — Ambulatory Visit (INDEPENDENT_AMBULATORY_CARE_PROVIDER_SITE_OTHER): Payer: Commercial Managed Care - PPO | Admitting: Primary Care

## 2020-12-26 ENCOUNTER — Other Ambulatory Visit: Payer: Self-pay

## 2020-12-26 ENCOUNTER — Encounter (INDEPENDENT_AMBULATORY_CARE_PROVIDER_SITE_OTHER): Payer: Self-pay | Admitting: Primary Care

## 2020-12-26 VITALS — BP 97/63 | HR 67 | Temp 97.5°F | Resp 16 | Wt 130.0 lb

## 2020-12-26 DIAGNOSIS — Z7689 Persons encountering health services in other specified circumstances: Secondary | ICD-10-CM | POA: Diagnosis not present

## 2020-12-26 DIAGNOSIS — W11XXXS Fall on and from ladder, sequela: Secondary | ICD-10-CM | POA: Diagnosis not present

## 2020-12-26 NOTE — Progress Notes (Signed)
  [facility]   Subjective:   Andrew Esparza is a 35 y.o. 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 Admit date to the hospital was 12/12/20, patient was discharged from the hospital on 12/15/20, patient was admitted for: Presented to the ED on 12/12/20 presented  as a level 2 trauma after a fall from a ladder. He was about 15-20 feet off the ground cutting a tree at home. Pain right leg 2/10   Past Medical History:  Diagnosis Date   Medical history non-contributory      No Known Allergies    Current Outpatient Medications on File Prior to Visit  Medication Sig Dispense Refill   acetaminophen (TYLENOL) 325 MG tablet Take 2 tablets (650 mg total) by mouth every 6 (six) hours as needed for mild pain or moderate pain. 60 tablet 0   methocarbamol (ROBAXIN) 500 MG tablet Take 1 tablet (500 mg total) by mouth every 8 (eight) hours as needed for muscle spasms. 30 tablet 0   docusate sodium (COLACE) 100 MG capsule Take 1 capsule (100 mg total) by mouth 2 (two) times daily. (Patient not taking: Reported on 12/26/2020) 10 capsule 0   oxyCODONE (OXY IR/ROXICODONE) 5 MG immediate release tablet Take 1 tablet (5 mg total) by mouth every 6 (six) hours as needed (5mg  for moderate pain, 10mg  for severe pain). (Patient not taking: Reported on 12/26/2020) 25 tablet 0   polyethylene glycol powder (GLYCOLAX/MIRALAX) 17 GM/SCOOP powder Dissolve 1 capful (17 g) in water and drink once daily as needed for mild constipation. (Patient not taking: Reported on 12/26/2020) 510 g 0   No current facility-administered medications on file prior to visit.     Review of System: Review of Systems  Musculoskeletal:        Leg pain s/p fx ORIF   All other systems reviewed and are negative.  Objective:  BP 97/63 (BP Location: Right Arm, Patient Position: Sitting, Cuff Size: Normal)   Pulse 67   Temp (!) 97.5 F (36.4 C)   Resp 16   Wt 130 lb (59  kg)   SpO2 97%   BMI 22.31 kg/m   Filed Weights   12/26/20 0953  Weight: 130 lb (59 kg)   Physical Exam: General Appearance: Well nourished,Thin frame in no apparent distress. Eyes: PERRLA, EOMs, conjunctiva no swelling or erythema Sinuses: No Frontal/maxillary tenderness ENT/Mouth: Ext aud canals clear, TMs without erythema, bulging. No erythema, swelling, or exudate on post pharynx.  Tonsils not swollen or erythematous. Hearing normal.  Neck: Supple, thyroid normal.  Respiratory: Respiratory effort normal, BS equal bilaterally without rales, rhonchi, wheezing or stridor.  Cardio: RRR with no MRGs. Brisk peripheral pulses without edema.  Abdomen: Soft, + BS.  Non tender, no guarding, rebound, hernias, masses. Lymphatics: Non tender without lymphadenopathy.  Musculoskeletal: right leg in a brace s/p fx decrease ROM and unstable gait uses crutches  Skin: Warm, dry without rashes, lesions, ecchymosis.  Neuro/Psyche wake and oriented X 3, normal affect, Insight and Judgment appropriate.    Assessment:   Diagnoses and all orders for this visit:  Encounter to establish care Establish care   Fall from ladder, sequela S/p fall see HPI and surgery followed by ortho 02/26/2021     This note has been created with 02/26/21. Any transcriptional errors are unintentional.   Grayce Sessions, NP 12/26/2020, 10:14 AM

## 2020-12-26 NOTE — Progress Notes (Signed)
Hospital follow up?

## 2020-12-26 NOTE — Patient Instructions (Signed)
8 Easy Exercises You Can Do Sitting Down  Got a chair? Then you're ready for this sit-down, total-body workout!.  Safety precaution: Pay attention to your body during the movements - if anything hurts or causes pain, stop immediately. And check with your doctor first before beginning this, or any, exercise program.   Sunshine Arm Circles Seated in a chair with good posture, hold a ball in both hands with arms extended above your head and/or in front of you, keeping elbows slightly bent. Visualizing the face of a clock out in front of you, begin by holding arms up overhead at 12 o'clock. Circle the ball around to go all the way around the clock in a controlled, fluid motion. When you've reached 12 o'clock again, reverse directions and circle the opposite way. Keep alternating circle directions for 8 repetitions. Rest. Do another set of 8 repetitions.  Modification: A ball is not required for this exercise. Imagine that you are holding a ball while performing the motion. If it is difficult to bring your arms overhead, extend them out in front of you and move arms as if drawing a circle on the wall with or without the ball.   Tummy Twists Seated in a chair with good posture, hold a ball with both hands close to the body, with elbows bent and pulled in close to the ribcage. Slowly rotate your torso to the right as far as you comfortably can, being sure to keep the rest of your body still and stable. Rotate back to the center and repeat in the opposite direction. Do this 8 times, with two twists counting as a full set. Rest. Do another 8 sets (two twists each). Modification: A ball is not required for this exercise. Imagine you are holding a ball while performing the motion, or hold a small object such as a can of soup or water bottle to add resistance   Ball Chest Press Seated in a chair with good posture, hold a ball with both hands at chest level, palms facing toward each other and  elbows bent. Avoid bending forward by keeping your shoulders back at all times. Squeeze the ball slightly as you push the ball away from you in a fluid motion, taking about 2 seconds to extend the arms. Squeeze your shoulder blades together as you pull the ball back toward your chest. Repeat the push and pull motion 10 to 15 times. Rest. Do another set of 10 to 15 repetitions.  Modification: For a greater challenge, add a Tai Chi feel by standing with one leg slightly in front of the other (with a chair nearby if needed for extra balance) and slowly rocking the entire body forward and back as you push the ball away and pull back in.     Front Arm Raises In a seated position with good posture, hold a ball in both hands with palms facing each other. Extend the arms out in front of your body, keeping your elbows slightly bent. Starting with the ball lowered toward the knees, slowly raise your arms to lift the ball up to shoulder level (no higher), then lower the ball back to the starting position, taking about 2 to 3 seconds to lift and lower. Repeat 10 to 15 times. Rest. Do another set of 10 to 15 repetitions. Modification: A ball is not required for this exercise. Imagine you are holding a ball as you perform the motion, or hold a small object, such as a can of soup or   water bottle for added resistance.        Inner Thigh Squeezes Sitting toward the edge of a chair with good posture and knees bent, place a ball in between your knees; press the knees together to squeeze the ball, taking about 1 to 2 seconds to squeeze. You should feel the resistance in your inner thighs. Slowly release, keeping slight tension on the ball so that it does not fall. Repeat 8 to 10 times. Rest. Do another set of 8 to 10 repetitions. Modification: For a greater challenge, change the count of the squeezes by squeezing the ball and holding for 5 seconds, then releasing again. Or, do short, quick pulsing  squeezes.     Knee Extensions Sitting toward the edge of a chair with good posture and bent knees, hold on to the sides of the chair with your hands. Extend the right knee out so that the toes come up toward the ceiling, being sure to keep the knee slightly bent without locking it through the entire movement. Lower the leg back to a bent position and repeat this movement 8 to 10 times, using about 2 seconds each to lift and lower the leg. Switch to the opposite leg and perform 8 to 10 repetitions. Rest briefly. Do another set of 8 to 10 repetitions for each leg. Modification: If you are more advanced, sitting in the same position as above, extend one leg out in front of you with toes pointed to the ceiling. Lift and lower the entire leg only as high as you comfortably can, keeping the knee slightly bent. The longer lever adds difficulty to the exercise.   Elbow to Knee Seated toward the edge of a chair with good posture and knees bent, start with your right arm extended up overhead. Slowly lift the left knee up as you lower your right elbow down toward your left knee, taking about 2 seconds to lower down. Try not to bend over at the waist. Release and go back to the starting position. Repeat 8 to 10 times. Switch sides and do 8 to 10 repetitions, pulling one elbow to the opposite knee. Rest. Do another set of 8 to 10 repetitions on each side. Modification: Try this (with a chair nearby for balance) exercise in a standing position for an increased range of motion.     Overhead Arm Extensions Seated in a chair with good posture, hold a ball with both hands and raise it up over your head, with arms extended without locking the elbows. Keeping the elbows pulled in toward the head, slowly bend the elbows to lower the ball down along the back of the neck, using about 2 seconds to go down, then 2 seconds to push the ball back up over your head. Repeat 8 to 10 times. Rest. Do another  set of 8 to 10 repetitions. Modification: Try seated tricep extensions (ball not required for this modification). Bending slightly forward with elbows tucked into your sides, slowly extend the elbows so that your forearms go back behind you, keeping the elbows pulled up and in for the entire movement. Return to the starting position and repeat. Hold soup cans or small weights for added resistance.   

## 2021-06-26 ENCOUNTER — Encounter (INDEPENDENT_AMBULATORY_CARE_PROVIDER_SITE_OTHER): Payer: Commercial Managed Care - PPO | Admitting: Primary Care

## 2023-03-08 IMAGING — DX DG KNEE 1-2V*R*
2 series · 2 of 2 positions shown · non-contrast
Comparison: None.

CLINICAL DATA: Fell off ladder

EXAM:
RIGHT KNEE - 1-2 VIEW

[knee lat]
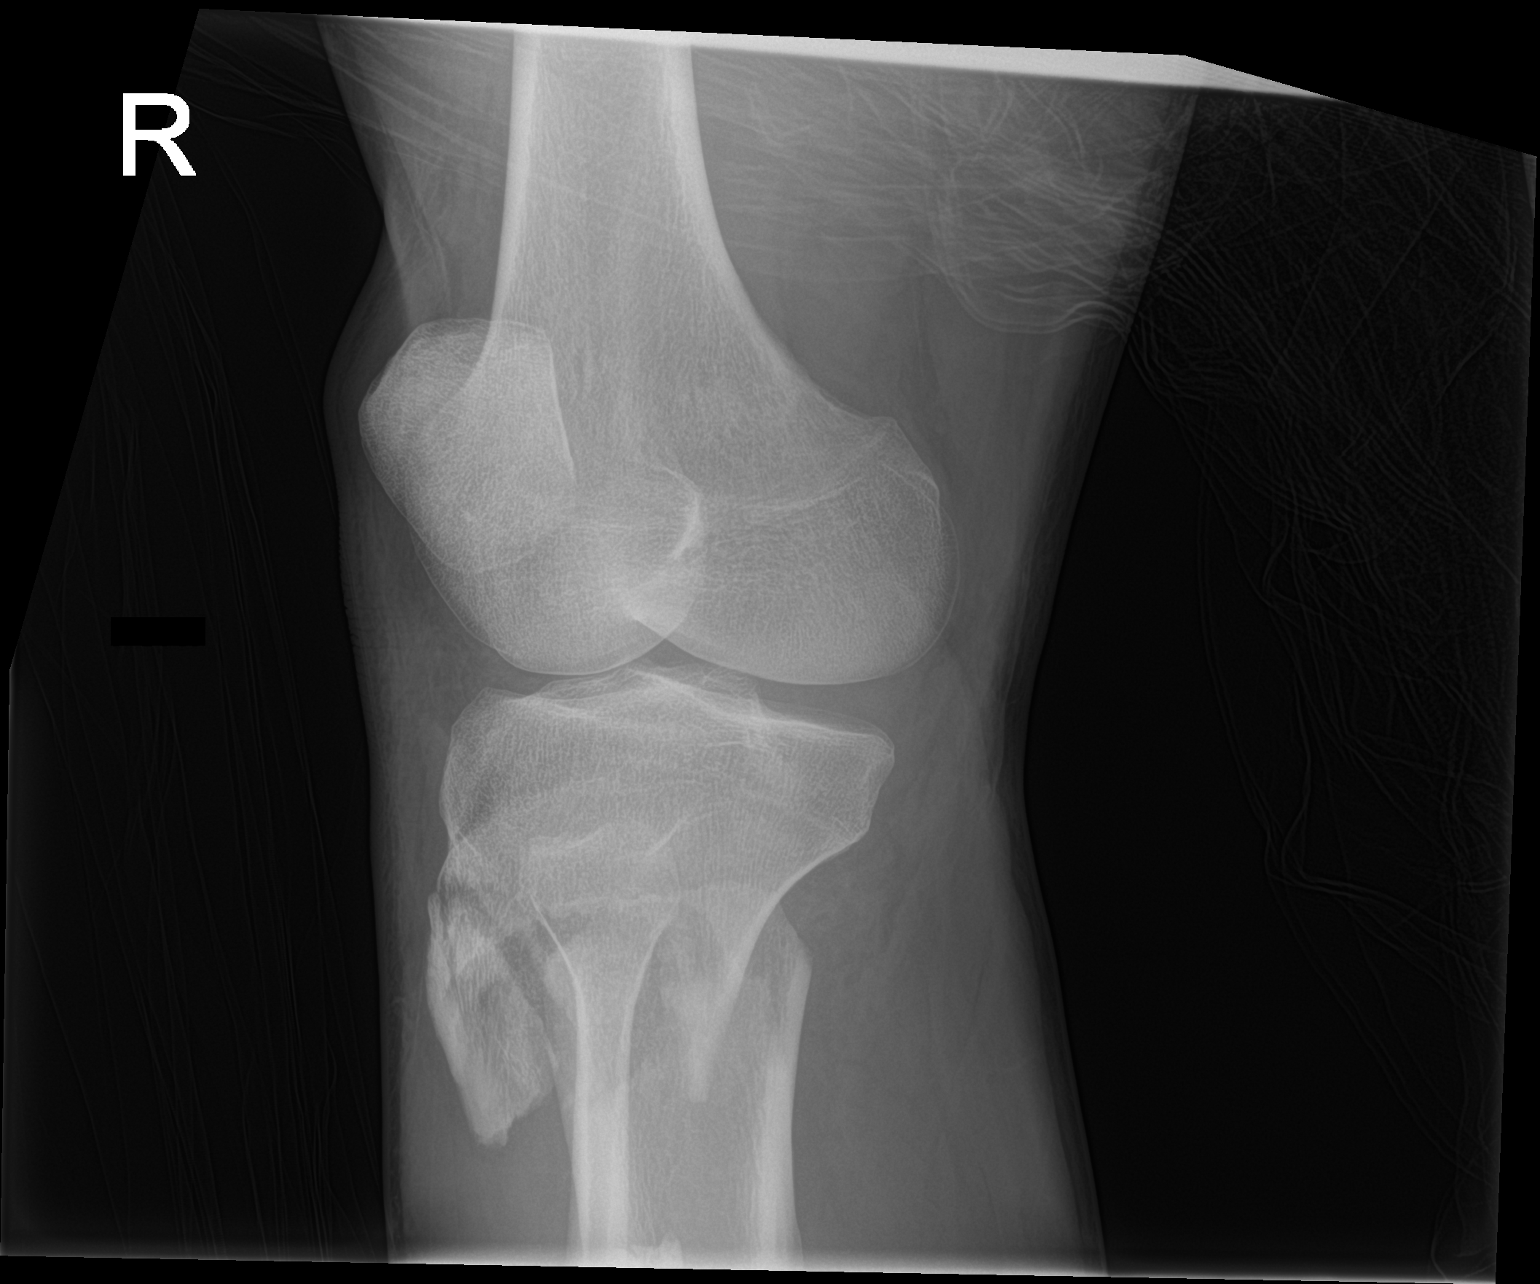

[knee ap]
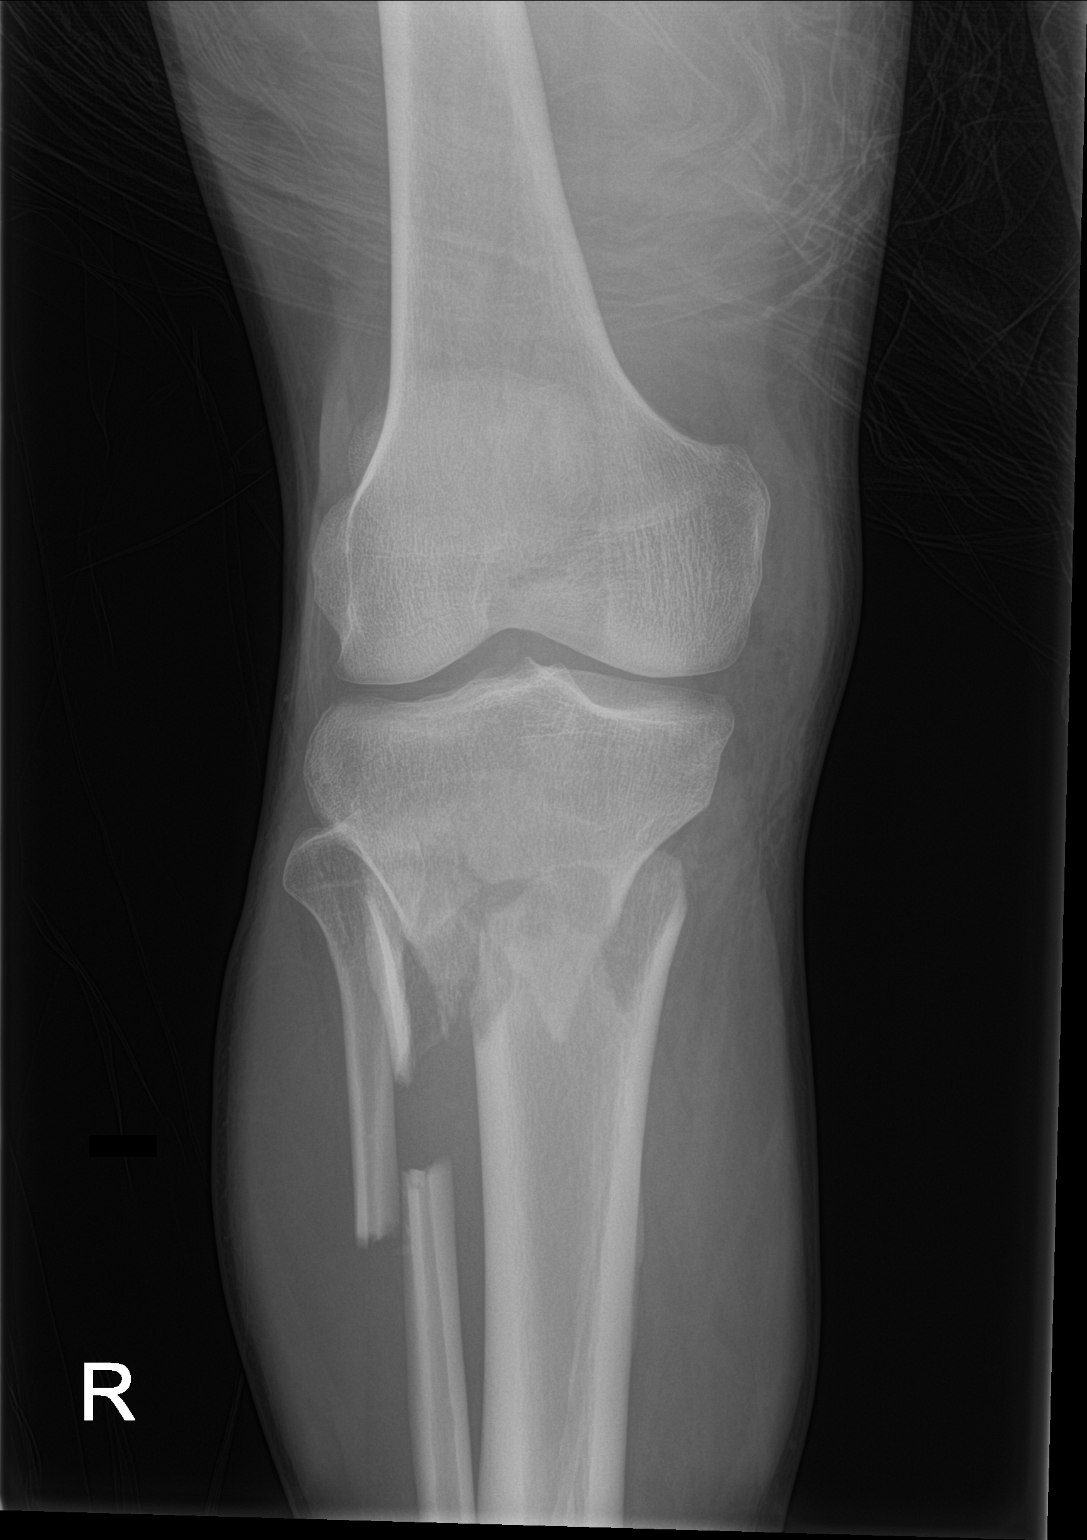

[2 of 2 positions shown; findings below may reference images not displayed]

FINDINGS: Highly comminuted fracture involving proximal metaphysis of the
tibia with about [DATE] shaft diameter medial and posterior
displacement of distal fracture fragment. Laterally and anteriorly
displaced tibial tuberosity fracture fragment. No definite articular
surface extension. Comminuted displaced and overriding proximal
fibular shaft fracture
IMPRESSION: 1. Comminuted and displaced proximal tibia fracture
2. Comminuted, displaced and overriding proximal fibular fracture
